# Patient Record
Sex: Female | Born: 1945 | Race: White | Hispanic: No | Marital: Married | Smoking: Former smoker
Health system: Southern US, Community
[De-identification: ages and names within clinical notes are randomized; demographics above are authoritative.]

## PROBLEM LIST (undated history)

## (undated) DIAGNOSIS — F32A Depression, unspecified: Secondary | ICD-10-CM

## (undated) DIAGNOSIS — E785 Hyperlipidemia, unspecified: Secondary | ICD-10-CM

## (undated) DIAGNOSIS — C50411 Malignant neoplasm of upper-outer quadrant of right female breast: Secondary | ICD-10-CM

## (undated) DIAGNOSIS — Z170 Estrogen receptor positive status [ER+]: Secondary | ICD-10-CM

## (undated) DIAGNOSIS — I10 Essential (primary) hypertension: Secondary | ICD-10-CM

## (undated) DIAGNOSIS — I447 Left bundle-branch block, unspecified: Secondary | ICD-10-CM

## (undated) DIAGNOSIS — F329 Major depressive disorder, single episode, unspecified: Secondary | ICD-10-CM

## (undated) DIAGNOSIS — E669 Obesity, unspecified: Secondary | ICD-10-CM

## (undated) DIAGNOSIS — N200 Calculus of kidney: Secondary | ICD-10-CM

## (undated) HISTORY — DX: Hyperlipidemia, unspecified: E78.5

## (undated) HISTORY — DX: Obesity, unspecified: E66.9

## (undated) HISTORY — PX: TONSILLECTOMY: SUR1361

## (undated) HISTORY — DX: Left bundle-branch block, unspecified: I44.7

## (undated) HISTORY — PX: CHOLECYSTECTOMY: SHX55

## (undated) HISTORY — DX: Estrogen receptor positive status (ER+): Z17.0

## (undated) HISTORY — DX: Essential (primary) hypertension: I10

## (undated) HISTORY — PX: ANKLE FRACTURE SURGERY: SHX122

## (undated) HISTORY — DX: Estrogen receptor positive status (ER+): C50.411

---

## 2006-12-02 ENCOUNTER — Ambulatory Visit: Payer: Self-pay

## 2006-12-16 ENCOUNTER — Ambulatory Visit: Payer: Self-pay

## 2007-12-03 ENCOUNTER — Ambulatory Visit: Payer: Self-pay | Admitting: Family Medicine

## 2008-04-10 ENCOUNTER — Encounter: Payer: Self-pay | Admitting: Cardiovascular Disease

## 2008-04-21 ENCOUNTER — Ambulatory Visit: Payer: Self-pay | Admitting: Cardiovascular Disease

## 2008-04-26 ENCOUNTER — Ambulatory Visit: Payer: Self-pay

## 2008-08-24 ENCOUNTER — Ambulatory Visit: Payer: Self-pay | Admitting: Gastroenterology

## 2008-12-06 ENCOUNTER — Ambulatory Visit: Payer: Self-pay | Admitting: Family Medicine

## 2008-12-12 DIAGNOSIS — I447 Left bundle-branch block, unspecified: Secondary | ICD-10-CM | POA: Insufficient documentation

## 2008-12-12 DIAGNOSIS — E785 Hyperlipidemia, unspecified: Secondary | ICD-10-CM | POA: Insufficient documentation

## 2008-12-12 DIAGNOSIS — E663 Overweight: Secondary | ICD-10-CM | POA: Insufficient documentation

## 2008-12-12 DIAGNOSIS — R0602 Shortness of breath: Secondary | ICD-10-CM | POA: Insufficient documentation

## 2008-12-12 DIAGNOSIS — I10 Essential (primary) hypertension: Secondary | ICD-10-CM | POA: Insufficient documentation

## 2010-01-11 ENCOUNTER — Ambulatory Visit: Payer: Self-pay | Admitting: Family Medicine

## 2010-07-07 HISTORY — PX: GASTRIC BYPASS: SHX52

## 2010-11-19 NOTE — Letter (Signed)
April 21, 2008    Galen Daft. Timoteo Gaul, MD  [facility]  [address]  [place], [place] [address]   RE:  Julia Sandoval, Julia Sandoval  MRN:  [id number]  /  DOB:  Oct 06, 1945   Dear Dr. Timoteo Gaul:   It was my pleasure to see Julia Sandoval on April 21, 2008, for  evaluation of abnormal EKG.   The patient is a delightful 65 year old woman with a history of  progressive exercise intolerance as well as exertional dyspnea.  She was  recently evaluated with EKG and echocardiogram.  I have a report of her  echocardiogram dated, April 07, 2008, which showed normal LV chamber  size and systolic function with an LVEF of 55%.  There is a notation of  mild LVH and mild diastolic dysfunction without significant valvular  abnormalities.  An EKG dated, April 10, 2008, shows sinus rhythm with  left bundle-branch block.   The patient has no history of cardiac disease.  She has no exertional  chest pain.  She does have stable exertional dyspnea with low-level  activity and also complains of marked fatigue with less than 5 minutes  of exercise.  She has attributed this to obesity.  She recently has had  an upper respiratory infection with cough and chest wall soreness over  the last 3 weeks, but this is slowly improving.  She denies  palpitations, lightheadedness, orthopnea, PND, edema, or syncope.   Current medications include:  1. Enalapril 5 mg daily.  2. Citalopram 30 mg daily.  3. Pravastatin 20 mg daily.  4. Multivitamin one daily.  5. Ginkgo one daily.  6. Vitamin E one daily.   ALLERGIES:  NKDA.   PAST MEDICAL HISTORY:  1. Essential hypertension.  2. Dyslipidemia.  3. Knee surgery in 2000.  4. Remote cesarean section x2 greater than 20 years ago.   No other hospitalizations or chronic illnesses.   SOCIAL HISTORY:  The patient is married.  She has 2 grown children.  She  lives locally in [place].  She is originally from [place] and  has lived here for approximately 3  years.  She works for a city [employer] in  [employer].  She is a former smoker, but quit in 2000.  She  drinks alcohol on rare occasion.  She does not participate in regular  exercise.   FAMILY HISTORY:  The patient's mother died at age 69 of a stroke and a  myocardial infarction.  Father died at age 71 from also Alzheimer  disease.  She has 3 siblings who are alive and well.   PHYSICAL EXAMINATION:  GENERAL:  The patient is alert and oriented.  She  is in no acute distress.  VITAL SIGNS:  Weight is 243 pounds, height 5 feet 6 inches, blood  pressure 128/86, heart rate is 88, respiratory rate 16.  HEENT:  Normal.  NECK:  Normal carotid upstrokes.  No bruits.  JVP normal.  No  thyromegaly or thyroid nodules.  LUNGS:  Clear bilaterally.  HEART:  The apex is discreet and nondisplaced.  There is no right  ventricular heave or lift.  The heart is regular rate and rhythm.  No  murmurs or gallops.  ABDOMEN:  Soft, obese, nontender.  No organomegaly.  No abdominal  bruits.  BACK:  No CVA tenderness.  EXTREMITIES:  No clubbing, cyanosis, or edema.  Peripheral pulses intact  and equal.  SKIN:  Warm and dry without rash.  NEUROLOGIC:  Cranial nerves II  through XII are intact.  Strength intact  and equal.   EKG reviewed shows sinus rhythm with a heart rate of 70 beats per  minute.  There is a first-degree AV block with a PR interval of 212  msec, complete left bundle-branch block is noted.   Labs from April 06, 2008, showed normal thyroid studies.  Unremarkable  CBC with hemoglobin 15.1, white blood cell count 8100, platelet count  293.  Glucose is 97, BUN and creatinine her 24 and 0.63, potassium 4.0.  LFTs were within normal limits .  I do not have a copy of her lipid  panel.   Echo as outlined above shows normal LV function and no significant  valvular abnormalities.   ASSESSMENT:  This is a 65 year old woman with exercise intolerance, left  bundle-branch block with a  background of hypertension, dyslipidemia,  former tobacco use, and obesity.  In the setting of her multiple cardiac  risk factors, I think she should undergo risk stratification for  ischemic heart disease.  Recommended an adenosine Myoview stress scan to  rule out significant ischemic heart disease.  Her echo results are  reassuring with normal left ventricular function.   Further recommendations pending the results of her stress study.  I  would like to see her back in followup after the study is completed.   Dr. Timoteo Gaul, thanks again for the opportunity to see this very nice lady.  Please feel free to call at any time with questions.    Sincerely,      Veverly Fells. Excell Seltzer, MD  Electronically Signed    MDC/MedQ  DD: 04/21/2008  DT: 04/22/2008  Job #: [id number]

## 2010-12-26 ENCOUNTER — Encounter: Payer: Self-pay | Admitting: Cardiovascular Disease

## 2011-02-21 ENCOUNTER — Ambulatory Visit: Payer: Self-pay | Admitting: Family Medicine

## 2011-02-25 ENCOUNTER — Ambulatory Visit: Payer: Self-pay | Admitting: Emergency Medicine

## 2011-02-26 ENCOUNTER — Ambulatory Visit: Payer: Self-pay | Admitting: Emergency Medicine

## 2011-02-28 ENCOUNTER — Ambulatory Visit: Payer: Self-pay | Admitting: Emergency Medicine

## 2011-03-04 LAB — PATHOLOGY REPORT

## 2011-07-08 HISTORY — PX: COLONOSCOPY: SHX174

## 2011-07-08 HISTORY — PX: BREAST EXCISIONAL BIOPSY: SUR124

## 2011-07-18 ENCOUNTER — Inpatient Hospital Stay: Payer: Self-pay | Admitting: General Practice

## 2011-07-18 LAB — CBC
HCT: 44 % (ref 35.0–47.0)
HGB: 15.2 g/dL (ref 12.0–16.0)
MCH: 32.1 pg (ref 26.0–34.0)
MCV: 93 fL (ref 80–100)
Platelet: 273 10*3/uL (ref 150–440)
RBC: 4.73 10*6/uL (ref 3.80–5.20)
WBC: 12.1 10*3/uL — ABNORMAL HIGH (ref 3.6–11.0)

## 2011-07-18 LAB — COMPREHENSIVE METABOLIC PANEL
Albumin: 3.6 g/dL (ref 3.4–5.0)
Alkaline Phosphatase: 71 U/L (ref 50–136)
BUN: 17 mg/dL (ref 7–18)
Bilirubin,Total: 0.4 mg/dL (ref 0.2–1.0)
Calcium, Total: 9.6 mg/dL (ref 8.5–10.1)
Chloride: 109 mmol/L — ABNORMAL HIGH (ref 98–107)
Co2: 24 mmol/L (ref 21–32)
Creatinine: 0.82 mg/dL (ref 0.60–1.30)
EGFR (African American): >60
Glucose: 100 mg/dL — ABNORMAL HIGH (ref 65–99)
Osmolality: 287 (ref 275–301)
SGOT(AST): 41 U/L — ABNORMAL HIGH (ref 15–37)
Sodium: 143 mmol/L (ref 136–145)

## 2011-07-18 LAB — TROPONIN I: Troponin-I: <0.02 ng/mL

## 2011-07-20 LAB — BASIC METABOLIC PANEL
Anion Gap: 8 (ref 7–16)
BUN: 8 mg/dL (ref 7–18)
Calcium, Total: 8 mg/dL — ABNORMAL LOW (ref 8.5–10.1)
Creatinine: 0.73 mg/dL (ref 0.60–1.30)
EGFR (African American): >60
EGFR (Non-African Amer.): >60
Sodium: 144 mmol/L (ref 136–145)

## 2011-07-20 LAB — HEMOGLOBIN: HGB: 12.3 g/dL (ref 12.0–16.0)

## 2011-07-23 ENCOUNTER — Encounter: Payer: Self-pay | Admitting: Internal Medicine

## 2011-08-08 ENCOUNTER — Encounter: Payer: Self-pay | Admitting: Internal Medicine

## 2011-08-18 LAB — URINALYSIS, COMPLETE
Blood: NEGATIVE
Glucose,UR: NEGATIVE mg/dL (ref 0–75)
Nitrite: NEGATIVE
Ph: 5 (ref 4.5–8.0)
Protein: NEGATIVE
RBC,UR: 2 /HPF (ref 0–5)
Squamous Epithelial: 7

## 2011-10-17 ENCOUNTER — Ambulatory Visit: Payer: Self-pay | Admitting: Bariatrics

## 2011-10-17 LAB — COMPREHENSIVE METABOLIC PANEL
Albumin: 3.4 g/dL (ref 3.4–5.0)
Alkaline Phosphatase: 13 U/L — ABNORMAL LOW (ref 50–136)
Anion Gap: 11 (ref 7–16)
BUN: 15 mg/dL (ref 7–18)
Bilirubin,Total: 0.6 mg/dL (ref 0.2–1.0)
Calcium, Total: 8.8 mg/dL (ref 8.5–10.1)
Chloride: 106 mmol/L (ref 98–107)
Co2: 20 mmol/L — ABNORMAL LOW (ref 21–32)
Creatinine: 0.53 mg/dL — ABNORMAL LOW (ref 0.60–1.30)
EGFR (African American): >60
EGFR (Non-African Amer.): >60
Osmolality: 275 (ref 275–301)
SGOT(AST): 31 U/L (ref 15–37)
Sodium: 137 mmol/L (ref 136–145)
Total Protein: 7.7 g/dL (ref 6.4–8.2)

## 2011-10-17 LAB — CBC WITH DIFFERENTIAL/PLATELET
Eosinophil %: 3.1 %
HCT: 44.2 % (ref 35.0–47.0)
HGB: 14.6 g/dL (ref 12.0–16.0)
Lymphocyte %: 32.3 %
MCH: 29.7 pg (ref 26.0–34.0)
MCHC: 33 g/dL (ref 32.0–36.0)
Monocyte #: 0.7 x10 3/mm (ref 0.2–0.9)
Monocyte %: 10.4 %
Neutrophil #: 3.6 10*3/uL (ref 1.4–6.5)
Platelet: 304 10*3/uL (ref 150–440)
RBC: 4.92 10*6/uL (ref 3.80–5.20)
WBC: 6.7 10*3/uL (ref 3.6–11.0)

## 2011-10-17 LAB — FOLATE: Folic Acid: 28.8 ng/mL (ref 3.1–100.0)

## 2011-10-17 LAB — PROTIME-INR
INR: 0.9
Prothrombin Time: 12.2 secs (ref 11.5–14.7)

## 2011-10-17 LAB — IRON AND TIBC: Unbound Iron-Bind.Cap.: 427 ug/dL

## 2011-10-17 LAB — HEMOGLOBIN A1C: Hemoglobin A1C: 6.1 % (ref 4.2–6.3)

## 2011-10-24 ENCOUNTER — Ambulatory Visit: Payer: Self-pay | Admitting: Bariatrics

## 2011-10-30 ENCOUNTER — Ambulatory Visit: Payer: Self-pay | Admitting: Bariatrics

## 2011-10-31 ENCOUNTER — Ambulatory Visit: Payer: Self-pay | Admitting: Bariatrics

## 2011-11-05 ENCOUNTER — Ambulatory Visit: Payer: Self-pay | Admitting: Bariatrics

## 2011-11-27 ENCOUNTER — Ambulatory Visit: Payer: Self-pay | Admitting: Bariatrics

## 2012-01-26 ENCOUNTER — Ambulatory Visit: Payer: Self-pay | Admitting: Bariatrics

## 2012-02-05 ENCOUNTER — Ambulatory Visit: Payer: Self-pay | Admitting: Bariatrics

## 2012-02-27 ENCOUNTER — Ambulatory Visit: Payer: Self-pay | Admitting: Family Medicine

## 2012-05-21 ENCOUNTER — Ambulatory Visit: Payer: Self-pay

## 2012-06-07 ENCOUNTER — Ambulatory Visit: Payer: Self-pay | Admitting: Emergency Medicine

## 2012-06-08 ENCOUNTER — Ambulatory Visit: Payer: Self-pay | Admitting: Emergency Medicine

## 2012-07-05 ENCOUNTER — Other Ambulatory Visit: Payer: Self-pay | Admitting: Bariatrics

## 2012-07-05 LAB — FERRITIN: Ferritin (ARMC): 49 ng/mL (ref 8–388)

## 2012-07-05 LAB — IRON: Iron: 103 ug/dL (ref 50–170)

## 2012-07-05 LAB — PHOSPHORUS: Phosphorus: 3.2 mg/dL (ref 2.5–4.9)

## 2012-07-05 LAB — AMYLASE: Amylase: 29 U/L (ref 25–115)

## 2013-04-13 ENCOUNTER — Ambulatory Visit: Payer: Self-pay | Admitting: Family Medicine

## 2013-06-09 ENCOUNTER — Ambulatory Visit: Payer: Self-pay | Admitting: Obstetrics & Gynecology

## 2013-06-09 LAB — CBC
MCH: 30.2 pg (ref 26.0–34.0)
MCHC: 33.8 g/dL (ref 32.0–36.0)
MCV: 89 fL (ref 80–100)
Platelet: 237 10*3/uL (ref 150–440)
RBC: 4.64 10*6/uL (ref 3.80–5.20)
RDW: 12.8 % (ref 11.5–14.5)

## 2013-06-17 ENCOUNTER — Ambulatory Visit: Payer: Self-pay | Admitting: Obstetrics & Gynecology

## 2013-06-28 ENCOUNTER — Ambulatory Visit: Payer: Self-pay | Admitting: Family Medicine

## 2013-07-04 ENCOUNTER — Ambulatory Visit: Payer: Self-pay | Admitting: Family Medicine

## 2013-07-04 HISTORY — PX: BREAST SURGERY: SHX581

## 2013-07-05 LAB — PATHOLOGY REPORT

## 2013-07-07 HISTORY — PX: DILATION AND CURETTAGE OF UTERUS: SHX78

## 2013-07-13 ENCOUNTER — Encounter: Payer: Self-pay | Admitting: *Deleted

## 2013-08-09 ENCOUNTER — Encounter: Payer: Self-pay | Admitting: General Surgery

## 2013-08-09 ENCOUNTER — Other Ambulatory Visit: Payer: BC Managed Care – PPO

## 2013-08-09 ENCOUNTER — Ambulatory Visit (INDEPENDENT_AMBULATORY_CARE_PROVIDER_SITE_OTHER): Payer: BC Managed Care – PPO | Admitting: General Surgery

## 2013-08-09 VITALS — BP 130/70 | HR 68 | Resp 12 | Ht 65.0 in | Wt 185.0 lb

## 2013-08-09 DIAGNOSIS — N6081 Other benign mammary dysplasias of right breast: Secondary | ICD-10-CM

## 2013-08-09 DIAGNOSIS — N63 Unspecified lump in unspecified breast: Secondary | ICD-10-CM

## 2013-08-09 DIAGNOSIS — N6089 Other benign mammary dysplasias of unspecified breast: Secondary | ICD-10-CM

## 2013-08-09 NOTE — Progress Notes (Signed)
Patient ID: Julia Sandoval, female   DOB: [birth year], 68 y.o.   MRN: [id number]  Chief Complaint  Patient presents with  . Other    mammogram    HPI Julia Sandoval is a 68 y.o. female who presents for a breast evaluation. The most recent mammogram was done on 07/04/13.Patient had an  right stereo biopsy done 07/04/14 Patient does perform regular self breast checks and gets regular mammograms done.    HPI  Past Medical History  Diagnosis Date  . Dyspnea   . Obesity   . Hypertension     Unspecified  . Hyperlipidemia     Mixed  . LBBB (left bundle branch block)     Past Surgical History  Procedure Laterality Date  . Total knee arthroplasty    . Cesarean section    . Gastric bypass  2012  . Dilation and curettage of uterus  2015  . Breast surgery Right 07/04/2013    Stereotactic biopsy for asymmetric density and calcifications: Flat epithelial atypia, intraductal papilloma, fragmented.    Family History  Problem Relation Age of Onset  . Stroke Other     Social History History  Substance Use Topics  . Smoking status: Former Smoker    Quit date: 07/07/1998  . Smokeless tobacco: Never Used  . Alcohol Use: Yes    No Known Allergies  Current Outpatient Prescriptions  Medication Sig Dispense Refill  . FLUoxetine (PROZAC) 20 MG capsule Take 1 capsule by mouth daily.       No current facility-administered medications for this visit.    Review of Systems Review of Systems  Constitutional: Negative.   Respiratory: Negative.   Cardiovascular: Negative.     Blood pressure 130/70, pulse 68, resp. rate 12, height 5\' 5"  (1.651 m), weight 185 lb (83.915 kg).  Physical Exam Physical Exam  Constitutional: She is oriented to person, place, and time. She appears well-developed and well-nourished.  Eyes: Conjunctivae are normal.  Neck: Neck supple.  Cardiovascular: Normal rate, regular rhythm and normal heart sounds.   Pulmonary/Chest: Breath sounds normal. Right breast  exhibits no inverted nipple, no mass, no nipple discharge, no skin change and no tenderness. Left breast exhibits no inverted nipple, no mass, no nipple discharge, no skin change and no tenderness.    Right breast 9 o'clock position, 7cm from the nipple showed a smoothly marginated fullness likely secondary to the biopsy.  Lymphadenopathy:    She has no cervical adenopathy.    She has no axillary adenopathy.  Neurological: She is alert and oriented to person, place, and time.  Skin: Skin is warm and dry.    Data Reviewed Review of the mammograms from 2000 06/08/2013 showed a focal asymmetry in the upper quadrant (9:00) position of the right breast it became more definitive over time. The remaining breasts are essentially fatty replaced. In 2014 she developed microcalcifications in this area.  Core biopsy completed by the radiology service on 07/04/2013 showed flat epithelial atypia (columnar cell changes with atypia), fibrocystic changes with associated microcalcifications as well as an intraductal fragmented papilloma. No evidence of malignancy.   Ultrasound examination of the right breast at the 9:00 position 7 cm from the nipple confirmed a 0.83 x 0.8 x 1.16 cm hypoechoic area with dense acoustic shadowing consistent with her previous biopsy site. This correlated with the postbiopsy mammograms.  Assessment    With the findings of a peripherally based papilloma as well as the epithelial atypia, wide excision of the area has been recommended  to confirm no upstaging.      Plan    Plan biopsy procedure was reviewed. The risks associated with surgery including those related to bleeding and infection were discussed.    Patient's surgery has been scheduled for 09-06-13 at [facility].   Robert Bellow 08/10/2013, 7:39 AM

## 2013-08-09 NOTE — Patient Instructions (Addendum)
Patient to have a right breast biopsy at [facility] Breast Biopsy A breast biopsy is a procedure where a sample of breast tissue is removed from your breast. The tissue is examined under a microscope to see if cancerous cells are present. A breast biopsy is done when there is:  Any undiagnosed breast mass (tumor).  Nipple abnormalities, dimpling, crusting, or ulcerations.  Abnormal discharge from the nipple, especially blood.  Redness, swelling, and pain of the breast.  Calcium deposits (calcifications) or abnormalities seen on a mammogram, ultrasound result, or results of magnetic resonance imaging (MRI).  Suspicious changes in the breast seen on your mammogram. If the tumor is found to be cancerous (malignant), a breast biopsy can help to determine what the best treatment is for you. There are many different types of breast biopsies. Talk to your caregiver about your options and which type is best for you. LET YOUR CAREGIVER KNOW ABOUT:  Allergies to food or medicine.  Medicines taken, including vitamins, herbs, eyedrops, over-the-counter medicines, and creams.  Use of steroids (by mouth or creams).  Previous problems with anesthetics or numbing medicines.  History of bleeding problems or blood clots.  Previous surgery.  Other health problems, including diabetes and kidney problems.  Any recent colds or infections.  Possibility of pregnancy, if this applies. RISKS AND COMPLICATIONS   Bleeding.  Infection.  Allergy to medicines.  Bruising and swelling of the breast.  Alteration in the shape of the breast.  Not finding the lump or abnormality.  Needing more surgery. BEFORE THE PROCEDURE  Arrange for someone to drive you home after the procedure.  Do not smoke for 2 weeks before the procedure. Stop smoking, if you smoke.  Do not drink alcohol for 24 hours before procedure.  Wear a good support bra to the procedure. PROCEDURE  You may be given a medicine to numb  the breast area (local anesthesia) or a medicine to make you sleep (general anesthesia) during the procedure. The following are the different types of biopsies that can be performed.   Fine-needle aspiration A thin needle is attached to a syringe and inserted into the breast lump. Fluid and cells are removed and then looked at under a microscope. If the breast lump cannot be felt, an ultrasound may be used to help locate the lump and place the needle in the correct area.   Core needle biopsy A wide, hollow needle (core needle) is inserted into the breast lump 3 6 times to get tissue samples or cores. The samples are removed. The needle is usually placed in the correct area by using an ultrasound or X-ray.   Stereotactic biopsy X-ray equipment and a computer are used to analyze X-ray pictures of the breast lump. The computer then finds exactly where the core needle needs to be inserted. Tissue samples are removed.   Vacuum-assisted biopsy A small incision (less than  inch) is made in your breast. A biopsy device that includes a hollow needle and vacuum is passed through the incision and into the breast tissue. The vacuum gently draws abnormal breast tissue into the needle to remove it. This type of biopsy removes a larger tissue sample than a regular core needle biopsy. No stitches are needed, and there is usually little scarring.  Ultrasound-guided core needle biopsy A high frequency ultrasound helps guide the core needle to the area of the mass or abnormality. An incision is made to insert the needle. Tissue samples are removed.  Open biopsy A larger incision  is made in the breast. Your caregiver will attempt to remove the whole breast lump or as much as possible. AFTER THE PROCEDURE  You will be taken to the recovery area. If you are doing well and have no problems, you will be allowed to go home.  You may notice bruising on your breast. This is normal.  Your caregiver may apply a pressure  dressing on your breast for 24 48 hours. A pressure dressing is a bandage that is wrapped tightly around the chest to stop fluid from collecting underneath tissues. Document Released: 06/23/2005 Document Revised: 10/18/2012 Document Reviewed: 07/24/2011 [facility] Patient Information 2014 [place].  Patient's surgery has been scheduled for 09-06-13 at [facility].

## 2013-08-10 ENCOUNTER — Other Ambulatory Visit: Payer: Self-pay | Admitting: General Surgery

## 2013-08-10 ENCOUNTER — Telehealth: Payer: Self-pay | Admitting: *Deleted

## 2013-08-10 ENCOUNTER — Encounter: Payer: Self-pay | Admitting: General Surgery

## 2013-08-10 DIAGNOSIS — N6081 Other benign mammary dysplasias of right breast: Secondary | ICD-10-CM

## 2013-08-10 NOTE — Telephone Encounter (Signed)
Patient has been scheduled for a pre-op visit on 08-30-13 at 10:45 am. She is aware of date, time, and instructions.

## 2013-08-10 NOTE — Telephone Encounter (Signed)
Message copied by Dominga Ferry on Wed Aug 10, 2013  9:48 AM ------      Message from: Allen, Forest Gleason      Created: Wed Aug 10, 2013  7:49 AM       This patient will need to come in for a very brief, no charge visit the week prior to surgery to confirm the area of concern is still visible ultrasound/clinical exam. Thank you ------

## 2013-08-29 ENCOUNTER — Ambulatory Visit: Payer: Self-pay | Admitting: Family Medicine

## 2013-08-30 ENCOUNTER — Ambulatory Visit (INDEPENDENT_AMBULATORY_CARE_PROVIDER_SITE_OTHER): Payer: BC Managed Care – PPO | Admitting: General Surgery

## 2013-08-30 ENCOUNTER — Telehealth: Payer: Self-pay

## 2013-08-30 ENCOUNTER — Other Ambulatory Visit (INDEPENDENT_AMBULATORY_CARE_PROVIDER_SITE_OTHER): Payer: BC Managed Care – PPO

## 2013-08-30 ENCOUNTER — Encounter: Payer: Self-pay | Admitting: General Surgery

## 2013-08-30 ENCOUNTER — Telehealth: Payer: Self-pay | Admitting: *Deleted

## 2013-08-30 VITALS — BP 158/78 | HR 66 | Resp 14 | Ht 65.0 in | Wt 188.0 lb

## 2013-08-30 DIAGNOSIS — N63 Unspecified lump in unspecified breast: Secondary | ICD-10-CM

## 2013-08-30 DIAGNOSIS — D249 Benign neoplasm of unspecified breast: Secondary | ICD-10-CM

## 2013-08-30 NOTE — Telephone Encounter (Signed)
Santiago Glad at [facility] called and said that the patient refused to have a cardiac cath done. The spoke with Dr Neoma Laming her cardiologist about this and he said he will still give cardiac clearance for her to have her surgery. She is faxing over the cardiac clearance.

## 2013-08-30 NOTE — Progress Notes (Signed)
Patient ID: Julia Sandoval, female   DOB: June 30, 1946, 68 y.o.   MRN: [id number]  Chief Complaint  Patient presents with  . Follow-up    breast ultrasoun    HPI Julia Sandoval is a 68 y.o. female here today for an right breast ultrasound to confirm the previous biopsy site is visible. She had undergone vacuum biopsy by the radiology staff with findings of flat epithelial atypia as well as an intraductal papilloma of frank malignancy. We are planning for reexcision of this area in the near future.  HPI  Past Medical History  Diagnosis Date  . Dyspnea   . Obesity   . Hypertension     Unspecified  . Hyperlipidemia     Mixed  . LBBB (left bundle branch block)     Past Surgical History  Procedure Laterality Date  . Total knee arthroplasty    . Cesarean section    . Gastric bypass  2012  . Dilation and curettage of uterus  2015  . Breast surgery Right 07/04/2013    Stereotactic biopsy for asymmetric density and calcifications: Flat epithelial atypia, intraductal papilloma, fragmented.    Family History  Problem Relation Age of Onset  . Stroke Other     Social History History  Substance Use Topics  . Smoking status: Former Smoker    Quit date: 07/07/1998  . Smokeless tobacco: Never Used  . Alcohol Use: Yes    No Known Allergies  Current Outpatient Prescriptions  Medication Sig Dispense Refill  . FLUoxetine (PROZAC) 20 MG capsule Take 1 capsule by mouth daily.       No current facility-administered medications for this visit.    Review of Systems Review of Systems  Constitutional: Negative.   Respiratory: Negative.   Cardiovascular: Negative.     Blood pressure 158/78, pulse 66, resp. rate 14, height 5\' 5"  (1.651 m), weight 188 lb (85.276 kg).  Physical Exam Physical Exam  Constitutional: She is oriented to person, place, and time. She appears well-developed and well-nourished.  Eyes: Conjunctivae are normal.  Neck: Neck supple.  Cardiovascular: Normal rate,  regular rhythm and normal heart sounds.   Pulmonary/Chest: Breath sounds normal. Right breast exhibits mass. Right breast exhibits no inverted nipple, no nipple discharge, no skin change and no tenderness. Left breast exhibits no inverted nipple, no mass, no nipple discharge, no skin change and no tenderness.    Abdominal: Bowel sounds are normal.  Lymphadenopathy:    She has no cervical adenopathy.    She has no axillary adenopathy.  Neurological: She is alert and oriented to person, place, and time.  Skin: Skin is warm and dry.    Data Reviewed Ultrasound examination of the right breast at the 9:00 position 7 cm from the nipple shows a 0.5 x 0.72 x 0.76 cm hypoechoic area with focal acoustic shadowing consistent with her biopsy site.  Assessment    Focal atypia/papilloma right breast.    Plan    We'll plan to proceed with excision under anesthesia in the near future.       Robert Bellow 08/30/2013, 4:31 PM

## 2013-08-30 NOTE — Telephone Encounter (Signed)
Per Santiago Glad in pre-admit, Dr. Humphrey Rolls will be doing a cardiac cath on this patient on Thursday (09-01-13). He will let us know if patient is cleared for surgery on 09-06-13.

## 2013-09-06 ENCOUNTER — Encounter: Payer: Self-pay | Admitting: General Surgery

## 2013-09-06 ENCOUNTER — Ambulatory Visit: Payer: Self-pay | Admitting: General Surgery

## 2013-09-06 DIAGNOSIS — D4860 Neoplasm of uncertain behavior of unspecified breast: Secondary | ICD-10-CM

## 2013-09-06 HISTORY — PX: BREAST SURGERY: SHX581

## 2013-09-09 LAB — PATHOLOGY REPORT

## 2013-09-12 ENCOUNTER — Encounter: Payer: Self-pay | Admitting: General Surgery

## 2013-09-12 ENCOUNTER — Telehealth: Payer: Self-pay | Admitting: *Deleted

## 2013-09-12 NOTE — Telephone Encounter (Signed)
Patient called the answering service on Friday to reschedule her Wednesday appointment (09-14-13) with Dr. Bary Castilla.   Message was left for patient this morning to call the office to reschedule.

## 2013-09-12 NOTE — Telephone Encounter (Signed)
Julia Sandoval moved patient's appointment to a later time on the same day per patient's request.

## 2013-09-14 ENCOUNTER — Ambulatory Visit (INDEPENDENT_AMBULATORY_CARE_PROVIDER_SITE_OTHER): Payer: BC Managed Care – PPO | Admitting: General Surgery

## 2013-09-14 ENCOUNTER — Ambulatory Visit: Payer: BC Managed Care – PPO | Admitting: General Surgery

## 2013-09-14 ENCOUNTER — Encounter: Payer: Self-pay | Admitting: General Surgery

## 2013-09-14 VITALS — BP 140/74 | HR 76 | Resp 14 | Ht 65.0 in | Wt 185.0 lb

## 2013-09-14 DIAGNOSIS — N6091 Unspecified benign mammary dysplasia of right breast: Secondary | ICD-10-CM

## 2013-09-14 DIAGNOSIS — D249 Benign neoplasm of unspecified breast: Secondary | ICD-10-CM

## 2013-09-14 DIAGNOSIS — N62 Hypertrophy of breast: Secondary | ICD-10-CM

## 2013-09-14 MED ORDER — RALOXIFENE HCL 60 MG PO TABS: 60.0000 mg | ORAL_TABLET | Freq: Every day | ORAL | Status: DC

## 2013-09-14 NOTE — Patient Instructions (Addendum)
Patient to try Evista . Patient to return in one month.

## 2013-09-14 NOTE — Progress Notes (Signed)
Patient ID: Julia Sandoval, female   DOB: [birth year], 68 y.o.   MRN: [id number]  Chief Complaint  Patient presents with  . Routine Post Op    Right breast wide excision    HPI Julia Sandoval is a 68 y.o. female here today for her post of right breast wide excision done on 09/06/13. Patient states she is doing well.  HPI  Past Medical History  Diagnosis Date  . Dyspnea   . Obesity   . Hypertension     Unspecified  . Hyperlipidemia     Mixed  . LBBB (left bundle branch block)     Past Surgical History  Procedure Laterality Date  . Total knee arthroplasty    . Cesarean section    . Gastric bypass  2012  . Dilation and curettage of uterus  2015  . Breast surgery Right 07/04/2013    Stereotactic biopsy for asymmetric density and calcifications: Flat epithelial atypia, intraductal papilloma, fragmented.  . Breast surgery Right 09/06/13    right breast wide excision    Family History  Problem Relation Age of Onset  . Stroke Other     Social History History  Substance Use Topics  . Smoking status: Former Smoker    Quit date: 07/07/1998  . Smokeless tobacco: Never Used  . Alcohol Use: Yes    No Known Allergies  Current Outpatient Prescriptions  Medication Sig Dispense Refill  . buPROPion (WELLBUTRIN XL) 150 MG 24 hr tablet       . FLUoxetine (PROZAC) 20 MG capsule Take 1 capsule by mouth daily.      . raloxifene (EVISTA) 60 MG tablet Take 1 tablet (60 mg total) by mouth daily.  30 tablet  11   No current facility-administered medications for this visit.    Review of Systems Review of Systems  Constitutional: Negative.   Respiratory: Negative.   Cardiovascular: Negative.     Blood pressure 140/74, pulse 76, resp. rate 14, height 5\' 5"  (1.651 m), weight 185 lb (83.915 kg).  Physical Exam Physical Exam  Constitutional: She is oriented to person, place, and time. She appears well-developed and well-nourished.  Eyes: Conjunctivae are normal.  Neck: Neck supple.   Cardiovascular: Normal rate, regular rhythm and normal heart sounds.   Pulmonary/Chest: Effort normal and breath sounds normal.  Right breast incision looks clean and healing well.   Lymphadenopathy:    She has no cervical adenopathy.    She has no axillary adenopathy.  Neurological: She is alert and oriented to person, place, and time.  Skin: Skin is warm and dry.    Data Reviewed Pathology showed focal epithelial atypia without DCIS.  Assessment    Doing well status post sedation of right breast papilloma with incidental finding of focal epithelial atypia.     Plan    The indication for the use of anti-estrogens to minimize the risk of recurrent problems and to decrease her risk of malignancy by 50% was discussed. She is taking two medications that would interfere with the metabolism of tamoxifen. She reports that they are working well for her psychiatric symptoms and it would be ideal to avoid a change if possible.  If costs does not become an issue, Evista 60 mg daily has been recommended.   We'll plan for a followup examination in one month.       Julia Sandoval 09/17/2013, 3:10 PM

## 2013-09-20 ENCOUNTER — Telehealth: Payer: Self-pay | Admitting: *Deleted

## 2013-09-20 NOTE — Telephone Encounter (Signed)
Calling to ask patient how much Evista was at her pharmacy copay/coupons etc.? 

## 2013-09-22 NOTE — Telephone Encounter (Signed)
Her copay with insurance is $100/month. Uses Julia Sandoval and checked with [employer]. She is to check with [employer] coupon website. She will check with pharmacy and call us back, and I told her I would let you known and if there was anything else to do we would call her.

## 2013-09-26 NOTE — Telephone Encounter (Signed)
Would put things on hold until follow up.  We can discuss other options at that time, but would likely require Dr. Bernita Buffy to change her present medications.  Will review pros/ cons at that time.

## 2013-09-27 NOTE — Telephone Encounter (Signed)
Coupon at front desk for pt to pick up

## 2013-09-28 ENCOUNTER — Telehealth: Payer: Self-pay | Admitting: *Deleted

## 2013-09-28 NOTE — Telephone Encounter (Signed)
She would like Brand name Evista called to [employer] (they will help her with the coupon) and it should be $30/month. RX called in.

## 2013-09-28 NOTE — Telephone Encounter (Signed)
Send RX for Evista, 60 mg, #30, 11 refills to pharmacy. Thanks.

## 2013-10-18 ENCOUNTER — Ambulatory Visit: Payer: BC Managed Care – PPO | Admitting: General Surgery

## 2013-10-20 ENCOUNTER — Encounter: Payer: Self-pay | Admitting: General Surgery

## 2013-10-20 ENCOUNTER — Ambulatory Visit (INDEPENDENT_AMBULATORY_CARE_PROVIDER_SITE_OTHER): Payer: BC Managed Care – PPO | Admitting: General Surgery

## 2013-10-20 VITALS — BP 134/72 | HR 76 | Resp 14 | Ht 66.0 in | Wt 180.0 lb

## 2013-10-20 DIAGNOSIS — N6089 Other benign mammary dysplasias of unspecified breast: Secondary | ICD-10-CM

## 2013-10-20 DIAGNOSIS — N6099 Unspecified benign mammary dysplasia of unspecified breast: Secondary | ICD-10-CM | POA: Insufficient documentation

## 2013-10-20 DIAGNOSIS — N6091 Unspecified benign mammary dysplasia of right breast: Secondary | ICD-10-CM

## 2013-10-20 DIAGNOSIS — D249 Benign neoplasm of unspecified breast: Secondary | ICD-10-CM

## 2013-10-20 NOTE — Progress Notes (Signed)
Patient ID: Julia Sandoval, female   DOB: 1945-08-08, 68 y.o.   MRN: [id number]  Chief Complaint  Patient presents with  . Routine Post Op    right breast wide excision    HPI Julia Sandoval is a 68 y.o. female here today for her one month post of right breast wide excision done on 09/06/13. Patient states she is doing well. The patient has initiated Evista therapy. Minimal side effects. This medication was chosen as there was less coflict with her previously prescribed antidepressants.  HPI  Past Medical History  Diagnosis Date  . Dyspnea   . Obesity   . Hypertension     Unspecified  . Hyperlipidemia     Mixed  . LBBB (left bundle branch block)     Past Surgical History  Procedure Laterality Date  . Total knee arthroplasty    . Cesarean section    . Gastric bypass  2012  . Dilation and curettage of uterus  2015  . Colonoscopy  2013  . Breast surgery Right 07/04/2013    Stereotactic biopsy for asymmetric density and calcifications: Flat epithelial atypia, intraductal papilloma, fragmented.  . Breast surgery Right 09/06/13    right breast wide excision    Family History  Problem Relation Age of Onset  . Stroke Other     Social History History  Substance Use Topics  . Smoking status: Former Smoker    Quit date: 07/07/1998  . Smokeless tobacco: Never Used  . Alcohol Use: Yes    No Known Allergies  Current Outpatient Prescriptions  Medication Sig Dispense Refill  . buPROPion (WELLBUTRIN XL) 150 MG 24 hr tablet       . FLUoxetine (PROZAC) 20 MG capsule Take 1 capsule by mouth daily.      Marland Kitchen lamoTRIgine (LAMICTAL) 100 MG tablet Take 100 mg by mouth daily.       . raloxifene (EVISTA) 60 MG tablet Take 60 mg by mouth daily.       No current facility-administered medications for this visit.    Review of Systems Review of Systems  Constitutional: Negative.   Respiratory: Negative.   Cardiovascular: Negative.     Blood pressure 134/72, pulse 76, resp. rate 14, height  5\' 6"  (1.676 m), weight 180 lb (81.647 kg).  Physical Exam Physical Exam  Constitutional: She is oriented to person, place, and time. She appears well-developed and well-nourished.  Cardiovascular: Normal rate, regular rhythm and normal heart sounds.   Pulmonary/Chest: Effort normal and breath sounds normal.  Right breast incision looks clean and healing well.   Neurological: She is alert and oriented to person, place, and time.  Skin: Skin is warm and dry.    Data Reviewed Breast biopsy showing evidence of ADH and flat epithelial atypia. Clear margins.  Assessment    Atypical ductal hyperplasia.     Plan    Arrangements will be made for bilateral mammograms in December 2014 at [facility] per patient request. Followup exam at that time. She continued to make use of Evista as prescribed.       PCP.Dear, Dyanne Iha Treazure Nery 10/20/2013, 9:01 PM

## 2013-10-20 NOTE — Patient Instructions (Addendum)
Patient to return in eight month bilateral breast diagnotic mammogram

## 2014-01-13 ENCOUNTER — Encounter: Payer: Self-pay | Admitting: General Surgery

## 2014-01-13 NOTE — Progress Notes (Signed)
Patient ID: Julia Sandoval, female   DOB: Aug 12, 1945, 68 y.o.   MRN: [id number] RX for Evista, 60 mg, # 90 w/ 4 refills sent to [facility].

## 2014-05-08 ENCOUNTER — Encounter: Payer: Self-pay | Admitting: General Surgery

## 2014-06-06 ENCOUNTER — Ambulatory Visit: Payer: BC Managed Care – PPO | Admitting: General Surgery

## 2014-06-13 ENCOUNTER — Ambulatory Visit: Payer: Self-pay | Admitting: General Surgery

## 2014-06-14 ENCOUNTER — Encounter: Payer: Self-pay | Admitting: General Surgery

## 2014-06-22 ENCOUNTER — Ambulatory Visit: Payer: BC Managed Care – PPO | Admitting: General Surgery

## 2014-07-04 ENCOUNTER — Encounter: Payer: Self-pay | Admitting: General Surgery

## 2014-07-04 ENCOUNTER — Ambulatory Visit (INDEPENDENT_AMBULATORY_CARE_PROVIDER_SITE_OTHER): Payer: BC Managed Care – PPO | Admitting: General Surgery

## 2014-07-04 VITALS — BP 132/70 | HR 70 | Resp 12 | Ht 65.0 in | Wt 170.0 lb

## 2014-07-04 DIAGNOSIS — N62 Hypertrophy of breast: Secondary | ICD-10-CM

## 2014-07-04 DIAGNOSIS — N6091 Unspecified benign mammary dysplasia of right breast: Secondary | ICD-10-CM

## 2014-07-04 NOTE — Patient Instructions (Signed)
Continue self breast exams. Call office for any new breast issues or concerns. 

## 2014-07-04 NOTE — Progress Notes (Signed)
Patient ID: Julia Sandoval, female   DOB: Jun 21, 1946, 68 y.o.   MRN: [id number]  Chief Complaint  Patient presents with  . Follow-up    HPI Julia Sandoval is a 68 y.o. female.  who presents for her 8 month follow up breast evaluation. The most recent mammogram was done on 06-13-14.  Patient does perform regular self breast checks and gets regular mammograms done.   No new breast issues. HPI  Past Medical History  Diagnosis Date  . Dyspnea   . Obesity   . Hypertension     Unspecified  . Hyperlipidemia     Mixed  . LBBB (left bundle branch block)     Past Surgical History  Procedure Laterality Date  . Total knee arthroplasty    . Cesarean section    . Gastric bypass  2012  . Dilation and curettage of uterus  2015  . Colonoscopy  2013  . Breast surgery Right 07/04/2013    Stereotactic biopsy for asymmetric density and calcifications: Flat epithelial atypia, intraductal papilloma, fragmented.  . Breast surgery Right 09/06/13    right breast wide excision    Family History  Problem Relation Age of Onset  . Stroke Other     Social History History  Substance Use Topics  . Smoking status: Former Smoker    Quit date: 07/07/1998  . Smokeless tobacco: Never Used  . Alcohol Use: Yes    No Known Allergies  Current Outpatient Prescriptions  Medication Sig Dispense Refill  . buPROPion (WELLBUTRIN XL) 150 MG 24 hr tablet     . QUEtiapine (SEROQUEL) 100 MG tablet Take 100 mg by mouth at bedtime.    . raloxifene (EVISTA) 60 MG tablet Take 60 mg by mouth daily.     No current facility-administered medications for this visit.    Review of Systems Review of Systems  Constitutional: Negative.   Respiratory: Negative.   Cardiovascular: Negative.     Blood pressure 132/70, pulse 70, resp. rate 12, height 5\' 5"  (1.651 m), weight 170 lb (77.111 kg).  Physical Exam Physical Exam  Constitutional: She is oriented to person, place, and time. She appears well-developed and  well-nourished.  Neck: Neck supple.  Cardiovascular: Normal rate, regular rhythm and normal heart sounds.   Pulmonary/Chest: Effort normal and breath sounds normal. Right breast exhibits no inverted nipple, no mass, no nipple discharge, no skin change and no tenderness. Left breast exhibits no inverted nipple, no mass, no nipple discharge, no skin change and no tenderness.    Lymphadenopathy:    She has no cervical adenopathy.  Neurological: She is alert and oriented to person, place, and time.  Skin: Skin is warm and dry.     4 x 7 cm patch right mid back that is slightly thicken and scaling.     Data Reviewed Bilateral mammograms dated 06/13/2014 completed [facility] were reviewed. No interval change. By red-1.  Assessment    Benign breast exam.    Plan    Tolerating antiestrogen therapy well.    Patient to return in one year with bilateral screening mammogram.   The patient has been encouraged to seek dermatology evaluation of the area on the right back as it is symptomatic and unexplained.  PCP:  Zella Richer 07/05/2014, 6:52 AM

## 2014-10-27 NOTE — Op Note (Signed)
PATIENT NAME:  Julia Sandoval, Julia Sandoval MR#:  [id number] DATE OF BIRTH:  12/25/1945  DATE OF PROCEDURE:  06/17/2013  PREOPERATIVE DIAGNOSIS: Postmenopausal bleeding and polyps.   POSTOPERATIVE DIAGNOSIS: Postmenopausal bleeding and polyps.   PROCEDURE: Hysteroscopy, dilation and curettage procedure with polypectomy.   SURGEON: Glean Salen, MD  ANESTHESIA: General.   ESTIMATED BLOOD LOSS: Minimal.   COMPLICATIONS: None.   FINDINGS: Multiple intrauterine and intracervical polyps.   DISPOSITION: To recovery room stable.   TECHNIQUE: The patient is prepped and draped in the usual sterile fashion after adequate anesthesia is obtained in the dorsal lithotomy position. Bladder is drained with a Robinson catheter. Speculum is placed, and the anterior lip of the cervix is grasped with a single-tooth tenaculum. The cervix is dilated with a small dilator, and the uterus is sounded to 7 cm. A 30 degree hysteroscope with glycine distention of intrauterine cavity is performed, with the above-mentioned findings visualized. Using a polypectomy forceps, several of the polyps are removed manually. Then, using a hysteroscopic resectoscope, the remaining portions of any polyps are removed as well as their bases are cauterized to prevent recurrence. Excellent hemostasis is noted. The curettings were sent separately along with the polyps to pathology for further review. Hysteroscope is removed, with excellent hemostasis noted and a zero discrepancy of glycine fluid. A 0 Vicryl suture is placed on the tenaculum spot to minimize bleeding. The patient goes to the recovery room in stable condition. All sponge, instrument and needle counts are correct.   ____________________________ R. Barnett Applebaum, MD rph:lb D: 06/17/2013 09:58:06 ET T: 06/17/2013 10:09:28 ET JOB#: [id number]  cc: Glean Salen, MD, <Dictator> Gae Dry MD ELECTRONICALLY SIGNED 06/17/2013 13:10

## 2014-10-28 NOTE — Op Note (Signed)
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  OPERATIVE PROCEDURE: Wide local excision, mastoplasty.  OPERATING SURGEON: Hervey Ard, MD.   ANESTHESIA: General by LMA under Dr. Kayleen Memos, Marcaine 0.5%. Marcaine with 1:200,000 units of epinephrine local infiltration.   ESTIMATED BLOOD LOSS: Minimal.  CLINICAL NOTE: This 69 year old woman had undergone a vacuum biopsy of an area of mammographic abnormality with findings of flat epithelial atypia. She was felt to be a candidate for wide local excision.   OPERATIVE NOTE: With the patient under adequate general anesthesia, the breast was prepped with ChloraPrep and draped. Ultrasound was used to confirm the previous biopsy cavity. A radial incision was made at the 9 o'clock position after the instillation of Marcaine with epinephrine for postoperative analgesia. The skin was incised sharply and the remaining dissection was completed with electrocautery. A 1 cm area of adipose tissue was divided and then a 4 x 4 x 5 cm block of tissue extending down to, but not including the pectoralis fascia, was then removed, orientated and specimen radiograph obtained. The previously placed biopsy clip was in the center of the specimen. The breast was elevated off the underlying pectoralis fascia circumferentially and then approximated with interrupted 2-0 Vicryl figure-of-eight sutures. The adipose layer was closed in multiple layers. The flaps were raised superiorly and inferiorly, approximately 6 mm in thickness and then this layer was approximated with 2-0 Vicryl interrupted sutures to eliminate any ridging within the breast tissue. The skin was closed with a running 4-0 Vicryl subcuticular suture. Benzoin and Steri-Strips followed by Telfa and fluff gauze  dressing was applied. The patient's bra was placed and she was taken to the recovery room in stable condition.     ____________________________ Robert Bellow, MD jwb:ce D: 09/06/2013 11:09:04 ET T: 09/06/2013 18:07:57 ET JOB#: [id number]  cc: Robert Bellow, MD, <Dictator> Meindert A. Brunetta Genera, MD Adriane Guglielmo Amedeo Kinsman MD ELECTRONICALLY SIGNED 09/08/2013 10:55

## 2014-10-29 NOTE — H&P (Signed)
Subjective/Chief Complaint right ankle pain    History of Present Illness 69 year old female slipped and fell coming down a step, twisting her right ankle. She was unable to stand or bear weight on the right lower extremity due to the pain. She denied any other injuries. She denied any loss of consciousness.   Past Med/Surgical Hx:  Hypercholesterolemia:   Depression:   HTN:   Cholecystectomy:   ALLERGIES:  No Known Allergies:   HOME MEDICATIONS:  enalapril 10 mg oral tablet: 1 tab(s) orally once a day, Active  pravastatin 40 mg oral tablet: 1 tab(s) orally once a day , Active  Wellbutrin $RemoveBef'500mg'gednXgGRle$ :  Every Day, Active  Family and Social History:   Family History Non-Contributory    Social History negative tobacco, negative Illicit drugs, Married.    Place of Living Home   Review of Systems:   Fever/Chills No    Cough No    Sputum No    Abdominal Pain No    Diarrhea No    Constipation No    Nausea/Vomiting No    SOB/DOE No    Chest Pain No   Physical Exam:   GEN WD, WN, obese    HEENT PERRL, Oropharynx clear    NECK supple    RESP normal resp effort  clear BS  no use of accessory muscles    CARD regular rate  no murmur  No LE edema  no JVD    ABD denies tenderness  soft  normal BS    EXTR Right ankle: Good capillary refill. Tender to medial and lateral  malleoli.  Splint in place.    NEURO motor/sensory function intact    PSYCH alert, A+O to time, place, person, good insight   Routine Chem:  11-Jan-13 18:12    Glucose, Serum 100   BUN 17   Creatinine (comp) 0.82   Potassium, Serum 4.4   Chloride, Serum 109   CO2, Serum 24   Calcium (Total), Serum 9.6  Hepatic:  11-Jan-13 18:12    Bilirubin, Total 0.4   Alkaline Phosphatase 71   SGPT (ALT) 39   SGOT (AST) 41   Total Protein, Serum 7.5   Albumin, Serum 3.6  Routine Chem:  11-Jan-13 18:12    Osmolality (calc) 287   eGFR (African American) >60   eGFR (Non-African American) >60   Anion  Gap 10  Routine Hem:  11-Jan-13 18:12    WBC (CBC) 12.1   RBC (CBC) 4.73   Hemoglobin (CBC) 15.2   Hematocrit (CBC) 44.0   Platelet Count (CBC) 273   MCV 93   MCH 32.1   MCHC 34.5   RDW 13.2  Cardiac:  11-Jan-13 18:12    Troponin I < 0.02     Assessment/Admission Diagnosis Right bimalleolar ankle fracture    Plan Closed reduction performed by ED physician. Recommend ORIF.  The risks and benefits of surgical intervention were discussed in detail with the patient. The patient expressed understanding of the risks and benefits and agreed with plans for surgery.   Surgical site signed as per "right site surgery" protocol.   Electronic Signatures: Dereck Leep (MD)  (Signed 11-Jan-13 23:07)  Authored: CHIEF COMPLAINT and HISTORY, PAST MEDICAL/SURGIAL HISTORY, ALLERGIES, HOME MEDICATIONS, FAMILY AND SOCIAL HISTORY, REVIEW OF SYSTEMS, PHYSICAL EXAM, LABS, ASSESSMENT AND PLAN   Last Updated: 11-Jan-13 23:07 by Dereck Leep (MD)

## 2014-10-29 NOTE — Op Note (Signed)
PATIENT NAME:  Julia Sandoval, Julia Sandoval MR#:  [id number] DATE OF BIRTH:  05-06-1946  DATE OF PROCEDURE:  07/19/2011  PREOPERATIVE DIAGNOSIS: Right bimalleolar ankle fracture.   POSTOPERATIVE DIAGNOSIS: Right bimalleolar ankle fracture.   PROCEDURE PERFORMED: Open reduction and internal fixation of right bimalleolar ankle fracture.   SURGEON: Laurice Record. Hooten, MD  ANESTHESIA: General.   ESTIMATED BLOOD LOSS: Minimal.   FLUIDS REPLACED: 700 mL of crystalloid.   TOURNIQUET TIME: 87 minutes.   DRAINS: None.   IMPLANTS UTILIZED: Synthes 8-hole one-third tubular plate, seven 3.5 mm cortical screws, one 4.0 mm fully threaded cancellous screw, and two 4.0 mm partially threaded cannulated cancellous screws.   INDICATIONS FOR SURGERY: The patient is a 69 year old female who slipped and fell twisting her right ankle. X-rays demonstrated a grossly displaced right bimalleolar ankle fracture. Provisional reduction had been performed in the emergency department. Recommendation was made for open reduction and internal fixation. Risks and benefits of surgical intervention were discussed in detail with the patient. She expressed her understanding of the risks and benefits and agreed with plans for surgical intervention.   PROCEDURE IN DETAIL: The patient was brought to the Operating Room and, after adequate general anesthesia was achieved, a tourniquet was placed on the patient's upper right thigh. The patient's right foot, ankle and lower leg were cleaned and prepped with alcohol and DuraPrep and draped in the usual sterile fashion. A "time out" was performed as per usual protocol. The right lower extremity was exsanguinated using an Esmarch, and the tourniquet was inflated to 300 mmHg. A lateral longitudinal incision made over the distal fibula. Dissection was carried down to the fracture site. A hematoma was evacuated and the site irrigated. Provisional reduction of the fracture if the distal fibula was performed and  maintained using bone reduction forceps with points. An 8-hole one-third tubular plate was then contoured so as to fit the lateral aspect of the distal fibula. The plate was then secured using a total of seven 3.5 mm cortical screws and one 4.0 mm fully threaded cancellus screw. Excellent reduction was appreciated in multiple planes using FluoroScan. The wound was irrigated with copious amounts of normal saline with antibiotic solution. The wound was then closed in layers using first #0 Vicryl followed by #2-0 Vicryl. Next, a posterior medial incision was made and dissection was carried down to the fracture site of the medial malleolus. Provisional reduction was performed and maintained using bone reduction forceps. Two 1.25 mm distally threaded guide pins were inserted in a retrograde fashion. Good position was appreciated. Two 4.0 mm partially threaded cannulated cancellus screws were then advanced over the guide pins. Good compression at the fracture site was appreciated. Guidewires were removed and the ankle was evaluated using FluoroScan. Excellent restoration of the ankle mortise was appreciated.   The medial wound was irrigated with copious amounts of normal saline with antibiotic solution. The wound was closed in layers using first #0 Vicryl followed by #2-0 Vicryl. The medial and lateral incisions were then closed using skin staples. 10 mL of 0.25% Marcaine was injected along the incision sites. A sterile dressing was applied followed by application of a posterior splint. The tourniquet was deflated after total tourniquet time of 87 minutes.   The patient tolerated the procedure well. She was transported to the Recovery Room in stable condition.  ____________________________ Laurice Record. Holley Bouche., MD jph:slb D: 07/19/2011 15:51:37 ET T: 07/19/2011 16:07:09 ET JOB#: [id number]  cc: Jeneen Rinks P. Holley Bouche., MD, <Dictator> JAMES P HOOTEN  JR MD ELECTRONICALLY SIGNED 07/25/2011 6:40

## 2014-10-29 NOTE — Discharge Summary (Signed)
PATIENT NAME:  Julia Sandoval, Julia Sandoval MR#:  [id number] DATE OF BIRTH:  1946/05/23  DATE OF ADMISSION:  07/18/2011 DATE OF DISCHARGE:  07/23/2011   ADMITTING DIAGNOSIS: Bimalleolar right ankle fracture.   DISCHARGE DIAGNOSIS: Bimalleolar right ankle fracture.  HISTORY: The patient is a 69 year old female who slipped and fell coming down a set of steps, twisting her right ankle. She was unable to stand or bear weight on the right lower extremity secondary to the increased discomfort. She denied any other injuries. She denied any loss of consciousness. The patient was seen at [facility] where x-rays were taken and revealed a bimalleolar ankle fracture. After discussion of the risks and benefits of surgical intervention, the patient expressed her understanding of the risks and benefits and agreed with plans for surgical intervention. The patient was admitted to the hospital. She had no medical problems and subsequently no medical consult was obtained.   PROCEDURE: Open reduction and internal fixation of right bimalleolar ankle fracture.   ANESTHESIA: General.   IMPLANTS UTILIZED: Synthes eight-hole one-third tubular plate, seven 3.5-mm cortical screws, one 4-mm fully threaded cancellus screw, and two 4-mm partially-threaded cannulated cancellus screws.   HOSPITAL COURSE: The patient tolerated the procedure very well. She had no complications. She was then taken to the PAC-U where she was stabilized and then transferred to the orthopedic floor. She began receiving anticoagulation therapy of Lovenox 30 mg subcutaneous every 12 hours per anesthesia and pharmacy protocol. However, this was changed to 40 mg q. 12 due to the size of the patient. She was fitted with TED stockings on the nonoperative leg. This was allowed to be removed one hour per eight-hour shift. She was also fitted with AVI compression foot pump on the nonoperative leg set at 130 mmHg. The right leg was elevated on two  pillows and Polar Care was applied to maintain a temperature of 40 to 50 degrees Fahrenheit. She has had normal capillary refill. Sensation to light touch has been intact and within normal limits to the right lower extremity. Able to move the toes well.   The patient's vital signs have been stable. She has been afebrile. Hemodynamically she was stable. No transfusions were given. She has denied any chest pain or shortness of breath. She was noted to have quite a bit of discomfort initially but this was finally under control upon being transferred.   Physical therapy was initiated on day one for gait training and transfers. This has been extremely slow due to condition and size. She has been unstable, having difficulty getting up and down, and moving. She was unable to move more than 5 feet during the hospital stay. Occupational therapy was also initiated on day one for activities of daily living and assistive devices.   DISPOSITION: The patient is being discharged to skilled nursing facility in improved stable condition.   DISCHARGE INSTRUCTIONS:  1. She is to be nonweightbearing to the right lower extremity. Continue elevating on two pillows.  2. Continue Polar Care maintaining a temperature of 40 to 50 degrees Fahrenheit.  3. Elevate the left heel off the bed using rolled towels. Trapeze frame to the bed.  4. She is placed on a regular diet.  5. She will need to follow up in the clinic in 5 to 6 days for a wound check and application of a more permanent orthotic.  6. Incentive spirometer q. 1 hour while awake. 7. Physical therapy for gait training and transfers. 8. Occupational therapy for activities  of daily living and assistive devices. 9.   Gatch foot of the bed.  DRUG ALLERGIES: No known drug allergies.   MEDICATIONS:  1. Roxicodone 5 to 10 mg every four hours p.r.n. for pain. 2. Tylenol ES 500 to 1000 mg every four hours p.r.n. for pain.  3. Senokot-S 1 tablet b.i.d.  4. Milk of  magnesia 30 mL b.i.d. p.r.n.  5. Dulcolax suppositories 10 mg rectally daily p.r.n. for constipation.  6. Enema soapsuds if no results with Milk of Magnesia.  7. Protonix 40 mg b.i.d.  8. Wellbutrin XL 300 mg daily. 9. Vasotec 20 mg daily. 10. Pravachol 40 mg at bedtime.  11. Celexa 40 mg daily.  12. Lovenox 40 mg subcutaneous every 12 hours for 14 days then discontinue and begin taking one 81-mg enteric-coated aspirin.   PAST MEDICAL HISTORY:  1. Hypercholesterolemia.  2. Depression.  3. Hypertension.   ____________________________ Vance Peper, PA jrw:bjt D: 07/23/2011 07:26:44 ET T: 07/23/2011 08:20:23 ET JOB#: [id number]  cc: Vance Peper, PA, <Dictator> Nolberto Cheuvront PA ELECTRONICALLY SIGNED 07/25/2011 16:13

## 2015-01-24 ENCOUNTER — Other Ambulatory Visit: Payer: Self-pay | Admitting: General Surgery

## 2015-02-20 ENCOUNTER — Other Ambulatory Visit: Payer: Self-pay | Admitting: General Surgery

## 2015-02-20 DIAGNOSIS — Z1231 Encounter for screening mammogram for malignant neoplasm of breast: Secondary | ICD-10-CM

## 2015-04-06 DIAGNOSIS — E559 Vitamin D deficiency, unspecified: Secondary | ICD-10-CM | POA: Insufficient documentation

## 2015-04-06 DIAGNOSIS — F329 Major depressive disorder, single episode, unspecified: Secondary | ICD-10-CM | POA: Insufficient documentation

## 2015-04-06 DIAGNOSIS — F419 Anxiety disorder, unspecified: Secondary | ICD-10-CM | POA: Insufficient documentation

## 2015-04-06 DIAGNOSIS — F32A Depression, unspecified: Secondary | ICD-10-CM | POA: Insufficient documentation

## 2015-04-11 ENCOUNTER — Encounter: Payer: Self-pay | Admitting: General Surgery

## 2015-04-11 ENCOUNTER — Telehealth: Payer: Self-pay | Admitting: *Deleted

## 2015-04-11 NOTE — Telephone Encounter (Signed)
I talked with the patient at length regarding the contraindications. She states she has not went to the pharmacy to obtain the new RX Osphena (for vaginal dryness) as of yet. She wishes to not pick up the new medications at this time and continue the Evista. She is aware to call for further questions and to call if she has trouble refilling her Evista. She may call Dr Rogelia Mire to see about natural treatment options.

## 2015-04-11 NOTE — Telephone Encounter (Signed)
-----   Message from Robert Bellow, MD sent at 04/11/2015 12:04 PM EDT ----- Please notify the patient I received a warning lead or from her pharmacy service. Since her last visit here in December when she was making use of raloxifen, she has been started on Osfena. These 2 medications each increase her risk ofblood clots. She should discuss with her PCP other alternatives to this new medication if she wishes to continue on the raloxifene

## 2015-04-27 ENCOUNTER — Other Ambulatory Visit: Payer: Self-pay

## 2015-04-27 DIAGNOSIS — Z1231 Encounter for screening mammogram for malignant neoplasm of breast: Secondary | ICD-10-CM

## 2015-06-12 ENCOUNTER — Other Ambulatory Visit: Payer: Self-pay | Admitting: General Surgery

## 2015-06-12 ENCOUNTER — Ambulatory Visit
Admission: RE | Admit: 2015-06-12 | Discharge: 2015-06-12 | Disposition: A | Discharge status: Home / Self Care | Payer: BLUE CROSS/BLUE SHIELD | Source: Ambulatory Visit | Attending: General Surgery | Admitting: General Surgery

## 2015-06-12 DIAGNOSIS — Z1231 Encounter for screening mammogram for malignant neoplasm of breast: Secondary | ICD-10-CM | POA: Diagnosis not present

## 2015-06-12 DIAGNOSIS — R928 Other abnormal and inconclusive findings on diagnostic imaging of breast: Secondary | ICD-10-CM

## 2015-06-15 ENCOUNTER — Ambulatory Visit: Payer: Self-pay

## 2015-06-18 ENCOUNTER — Ambulatory Visit: Payer: Self-pay

## 2015-06-26 ENCOUNTER — Ambulatory Visit: Payer: BLUE CROSS/BLUE SHIELD | Attending: General Surgery

## 2015-06-26 ENCOUNTER — Ambulatory Visit: Payer: Self-pay | Admitting: General Surgery

## 2015-06-26 ENCOUNTER — Other Ambulatory Visit: Payer: BLUE CROSS/BLUE SHIELD

## 2015-07-04 ENCOUNTER — Emergency Department: Payer: BLUE CROSS/BLUE SHIELD

## 2015-07-04 ENCOUNTER — Encounter: Payer: Self-pay | Admitting: Medical Oncology

## 2015-07-04 ENCOUNTER — Emergency Department
Admission: EM | Admit: 2015-07-04 | Discharge: 2015-07-05 | Payer: BLUE CROSS/BLUE SHIELD | Attending: Emergency Medicine | Admitting: Emergency Medicine

## 2015-07-04 DIAGNOSIS — Z87891 Personal history of nicotine dependence: Secondary | ICD-10-CM | POA: Diagnosis not present

## 2015-07-04 DIAGNOSIS — K859 Acute pancreatitis without necrosis or infection, unspecified: Secondary | ICD-10-CM | POA: Insufficient documentation

## 2015-07-04 DIAGNOSIS — Z79899 Other long term (current) drug therapy: Secondary | ICD-10-CM | POA: Insufficient documentation

## 2015-07-04 DIAGNOSIS — I10 Essential (primary) hypertension: Secondary | ICD-10-CM | POA: Insufficient documentation

## 2015-07-04 DIAGNOSIS — K8051 Calculus of bile duct without cholangitis or cholecystitis with obstruction: Secondary | ICD-10-CM | POA: Diagnosis not present

## 2015-07-04 DIAGNOSIS — R1013 Epigastric pain: Secondary | ICD-10-CM | POA: Diagnosis present

## 2015-07-04 HISTORY — DX: Major depressive disorder, single episode, unspecified: F32.9

## 2015-07-04 HISTORY — DX: Depression, unspecified: F32.A

## 2015-07-04 LAB — URINALYSIS COMPLETE WITH MICROSCOPIC (ARMC ONLY)
Bilirubin Urine: NEGATIVE
Glucose, UA: NEGATIVE mg/dL
HGB URINE DIPSTICK: NEGATIVE
Leukocytes, UA: NEGATIVE
NITRITE: NEGATIVE
PROTEIN: NEGATIVE mg/dL
SPECIFIC GRAVITY, URINE: 1.033 — AB (ref 1.005–1.030)
pH: 6 (ref 5.0–8.0)

## 2015-07-04 LAB — CBC
HEMATOCRIT: 33.2 % — AB (ref 35.0–47.0)
HEMOGLOBIN: 11.2 g/dL — AB (ref 12.0–16.0)
MCH: 30.4 pg (ref 26.0–34.0)
MCHC: 33.8 g/dL (ref 32.0–36.0)
MCV: 89.9 fL (ref 80.0–100.0)
Platelets: 362 10*3/uL (ref 150–440)
RBC: 3.7 MIL/uL — ABNORMAL LOW (ref 3.80–5.20)
RDW: 17.5 % — ABNORMAL HIGH (ref 11.5–14.5)
WBC: 6.2 10*3/uL (ref 3.6–11.0)

## 2015-07-04 LAB — TROPONIN I

## 2015-07-04 LAB — COMPREHENSIVE METABOLIC PANEL
ALBUMIN: 3.5 g/dL (ref 3.5–5.0)
ALT: 247 U/L — ABNORMAL HIGH (ref 14–54)
ANION GAP: 8 (ref 5–15)
AST: 361 U/L — ABNORMAL HIGH (ref 15–41)
Alkaline Phosphatase: 347 U/L — ABNORMAL HIGH (ref 38–126)
BILIRUBIN TOTAL: 2 mg/dL — AB (ref 0.3–1.2)
BUN: 10 mg/dL (ref 6–20)
CHLORIDE: 109 mmol/L (ref 101–111)
CO2: 24 mmol/L (ref 22–32)
Calcium: 9.2 mg/dL (ref 8.9–10.3)
Creatinine, Ser: 0.47 mg/dL (ref 0.44–1.00)
GFR calc Af Amer: >60 mL/min (ref >60)
GFR calc non Af Amer: >60 mL/min (ref >60)
GLUCOSE: 154 mg/dL — AB (ref 65–99)
POTASSIUM: 3.9 mmol/L (ref 3.5–5.1)
SODIUM: 141 mmol/L (ref 135–145)
TOTAL PROTEIN: 6.8 g/dL (ref 6.5–8.1)

## 2015-07-04 LAB — LIPASE, BLOOD: LIPASE: 9933 U/L — AB (ref 11–51)

## 2015-07-04 MED ORDER — SODIUM CHLORIDE 0.9 % IV BOLUS (SEPSIS)
1000.0000 mL | Freq: Once | INTRAVENOUS | Status: AC
  Administered 2015-07-04: 1000 mL via INTRAVENOUS

## 2015-07-04 MED ORDER — IOHEXOL 240 MG/ML SOLN
25.0000 mL | Freq: Once | INTRAMUSCULAR | Status: AC | PRN
  Administered 2015-07-04: 25 mL via ORAL

## 2015-07-04 MED ORDER — ONDANSETRON HCL 4 MG/2ML IJ SOLN
INTRAMUSCULAR | Status: AC
  Filled 2015-07-04: qty 2

## 2015-07-04 MED ORDER — ONDANSETRON HCL 4 MG/2ML IJ SOLN
4.0000 mg | Freq: Once | INTRAMUSCULAR | Status: AC
  Administered 2015-07-04: 4 mg via INTRAVENOUS
  Filled 2015-07-04: qty 2

## 2015-07-04 MED ORDER — IOHEXOL 300 MG/ML  SOLN
100.0000 mL | Freq: Once | INTRAMUSCULAR | Status: AC | PRN
Start: 2015-07-04 — End: 2015-07-04
  Administered 2015-07-04: 100 mL via INTRAVENOUS

## 2015-07-04 MED ORDER — ONDANSETRON HCL 4 MG/2ML IJ SOLN
4.0000 mg | Freq: Once | INTRAMUSCULAR | Status: AC
  Administered 2015-07-04: 4 mg via INTRAVENOUS

## 2015-07-04 MED ORDER — HYDROMORPHONE HCL 1 MG/ML IJ SOLN
1.0000 mg | Freq: Once | INTRAMUSCULAR | Status: AC
  Administered 2015-07-04: 1 mg via INTRAVENOUS
  Filled 2015-07-04: qty 1

## 2015-07-04 MED ORDER — MORPHINE SULFATE (PF) 4 MG/ML IV SOLN
4.0000 mg | Freq: Once | INTRAVENOUS | Status: AC
  Administered 2015-07-04: 4 mg via INTRAVENOUS
  Filled 2015-07-04: qty 1

## 2015-07-04 NOTE — ED Notes (Signed)
Pt reports upper abd pain that began 2 hrs pta, reports nausea without vomiting.

## 2015-07-04 NOTE — ED Provider Notes (Addendum)
[facility] Emergency Department Provider Note  ____________________________________________   I have reviewed the triage vital signs and the nursing notes.   HISTORY  Chief Complaint Abdominal Pain    HPI Julia Sandoval is a 69 y.o. female of the history of gastric bypass 3 years ago presents today complaining of epigastric abdominal pain and vomiting this afternoon. It began gradually. Denies any fever or chills. Vomiting started while she was here. As had no hematemesis no diarrhea. The pain is in the epigastric region. There she has no chest pain or shortness of breath. She denies melena or bright red blood per rectum.   Past Medical History  Diagnosis Date  . Dyspnea   . Obesity   . Hypertension     Unspecified  . Hyperlipidemia     Mixed  . LBBB (left bundle branch block)   . Depression     Patient Active Problem List   Diagnosis Date Noted  . Atypical ductal hyperplasia of right breast 10/20/2013  . 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 Flat epithelial atypia of right breast 08/10/2013  . HYPERLIPIDEMIA-MIXED 12/12/2008  . OVERWEIGHT/OBESITY 12/12/2008  . HYPERTENSION, UNSPECIFIED 12/12/2008  . LBBB 12/12/2008  . DYSPNEA 12/12/2008    Past Surgical History  Procedure Laterality Date  . Total knee arthroplasty    . Cesarean section    . Gastric bypass  2012  . Dilation and curettage of uterus  2015  . Colonoscopy  2013  . Breast surgery Right 07/04/2013    Stereotactic biopsy for asymmetric density and calcifications: Flat epithelial atypia, intraductal papilloma, fragmented.  . Breast surgery Right 09/06/13    Right breast wide excision, ADH only  . Breast biopsy Right     neg bx    Current Outpatient Rx  Name  Route  Sig  Dispense  Refill  . buPROPion (WELLBUTRIN XL) 150 MG 24 hr tablet               . QUEtiapine (SEROQUEL) 100 MG tablet   Oral   Take 100 mg by mouth at bedtime.         . raloxifene (EVISTA) 60  MG tablet      TAKE 1 TABLET DAILY   90 tablet   2     Allergies Review of patient's allergies indicates no known allergies.  Family History  Problem Relation Age of Onset  . Stroke Other     Social History Social History  Substance Use Topics  . Smoking status: Former Smoker    Quit date: 07/07/1998  . Smokeless tobacco: Never Used  . Alcohol Use: Yes    Review of Systems Constitutional: No fever/chills Eyes: No visual changes. ENT: No sore throat. No stiff neck no neck pain Cardiovascular: Denies chest pain. Respiratory: Denies shortness of breath. Gastrointestinal:   Positive retching/vomiting.  No diarrhea.  No constipation. Genitourinary: Negative for dysuria. Musculoskeletal: Negative lower extremity swelling Skin: Negative for rash. Neurological: Negative for headaches, focal weakness or numbness. 10-point ROS otherwise negative.  ____________________________________________   PHYSICAL EXAM:  VITAL SIGNS: ED Triage Vitals  Enc Vitals Group     BP 07/04/15 1755 155/91 mmHg     Pulse Rate 07/04/15 1755 63     Resp 07/04/15 1755 18     Temp 07/04/15 1755 97.8 F (36.6 C)     Temp Source 07/04/15 1755 Oral     SpO2 07/04/15 1755 97 %     Weight 07/04/15 1755 170 lb (  77.111 kg)     Height 07/04/15 1755 5\' 4"  (1.626 m)     Head Cir --      Peak Flow --      Pain Score 07/04/15 1755 8     Pain Loc --      Pain Edu? --      Excl. in [place]? --     Constitutional: Alert and oriented. Well appearing and in no acute distress. Eyes: Conjunctivae are normal. PERRL. EOMI. Head: Atraumatic. Nose: No congestion/rhinnorhea. Mouth/Throat: Mucous membranes are moist.  Oropharynx non-erythematous. Neck: No stridor.   Nontender with no meningismus Cardiovascular: Normal rate, regular rhythm. Grossly normal heart sounds.  Good peripheral circulation. Respiratory: Normal respiratory effort.  No retractions. Lungs CTAB. Abdominal: Soft with epigastric tenderness and  voluntary guarding No distention.no  rebound Back:  There is no focal tenderness or step off there is no midline tenderness there are no lesions noted. there is no CVA tenderness Musculoskeletal: No lower extremity tenderness. No joint effusions, no DVT signs strong distal pulses no edema Neurologic:  Normal speech and language. No gross focal neurologic deficits are appreciated.  Skin:  Skin is warm, dry and intact. No rash noted. Psychiatric: Mood and affect are normal. Speech and behavior are normal.  ____________________________________________   LABS (all labs ordered are listed, but only abnormal results are displayed)  Labs Reviewed  LIPASE, BLOOD - Abnormal; Notable for the following:    Lipase 9933 (*)    All other components within normal limits  COMPREHENSIVE METABOLIC PANEL - Abnormal; Notable for the following:    Glucose, Bld 154 (*)    AST 361 (*)    ALT 247 (*)    Alkaline Phosphatase 347 (*)    Total Bilirubin 2.0 (*)    All other components within normal limits  CBC - Abnormal; Notable for the following:    RBC 3.70 (*)    Hemoglobin 11.2 (*)    HCT 33.2 (*)    RDW 17.5 (*)    All other components within normal limits  TROPONIN I  URINALYSIS COMPLETEWITH MICROSCOPIC ([facility] ONLY)   ____________________________________________  EKG  I personally interpreted any EKGs ordered by me or triage ______________________________________  RADIOLOGY  I reviewed any imaging ordered by me or triage that were performed during my shift ____________________________________________   PROCEDURES  Procedure(s) performed: None  Critical Care performed: None  ____________________________________________   INITIAL IMPRESSION / ASSESSMENT AND PLAN / ED COURSE  Pertinent labs & imaging results that were available during my care of the patient were reviewed by me and considered in my medical decision making (see chart for details).  Patient says was gastric bypass  with a lipase of 9933, suggestive of acute pancreatitis, and CT scan is pending. Liver function tests are also somewhat elevated. Patient also may require ultrasound if CT does not betray causative pathology. Patient is status post cholecystectomy in the past of note. For this reason we will start with CT.    ----------------------------------------- 10:44 PM on 07/04/2015 -----------------------------------------  I have paged GI for this, awaiting call back.  Dr. Vira Agar states that he does not form ERCP. I have therefore talked to the patient and she would prefer to be transferred to [facility]. We are paging the [facility] transfer center. Patient requires ERCP for choledocholithiasis  .----------------------------------------- 11:36 PM on 07/04/2015 -----------------------------------------  Awaiting callback from [facility] patient comfortable at this time.Marland Kitchen  ----------------------------------------- 11:40 PM on 07/04/2015 -----------------------------------------  Discussed care with the excellent doctor [facility]  Sabra Heck, who graciously accepts patient in ER to ER transfer. ____________________________________________   FINAL CLINICAL IMPRESSION(S) / ED DIAGNOSES  Final diagnoses:  None     Schuyler Amor, MD 07/04/15 2029  Schuyler Amor, MD 07/04/15 2049  Schuyler Amor, MD 07/04/15 [place], MD 07/04/15 2257  Schuyler Amor, MD 07/04/15 [id number]  Schuyler Amor, MD 07/04/15 [phone]

## 2015-07-04 NOTE — ED Notes (Signed)
Report given to Christine, RN

## 2015-07-04 NOTE — ED Notes (Signed)
Pt presents to ED with c/o severe abdominal pain and nausea. Pt appears diaphoretic. Pt is pacing around room, moaning and unable to sit still. Pt dry heaving multiple times during assessment. Pt is awake and alert, able to answer questions appropriately at this time... Husband at bedside.

## 2015-07-04 NOTE — ED Notes (Signed)
Pt has returned from CT... Assisted pt to urinate on toilet. Pt now resting on stretcher. Reports increasing nausea. MD made aware of same. VORB 4mg  zofran IV once. Order to be carried out by this RN.

## 2015-07-05 DIAGNOSIS — K851 Biliary acute pancreatitis without necrosis or infection: Secondary | ICD-10-CM | POA: Insufficient documentation

## 2015-07-05 MED ORDER — PROMETHAZINE HCL 25 MG/ML IJ SOLN
INTRAMUSCULAR | Status: AC
  Administered 2015-07-05: 12.5 mg via INTRAVENOUS
  Filled 2015-07-05: qty 1

## 2015-07-05 MED ORDER — PROMETHAZINE HCL 25 MG/ML IJ SOLN
12.5000 mg | Freq: Four times a day (QID) | INTRAMUSCULAR | Status: DC | PRN
  Administered 2015-07-05: 12.5 mg via INTRAVENOUS

## 2015-07-05 NOTE — ED Notes (Signed)
Per dr. Burlene Arnt request, pt placed on 2L o2 via 

## 2015-07-08 HISTORY — PX: SMALL INTESTINE SURGERY: SHX150

## 2015-08-29 ENCOUNTER — Encounter: Payer: Self-pay | Admitting: *Deleted

## 2015-08-31 ENCOUNTER — Ambulatory Visit: Payer: BLUE CROSS/BLUE SHIELD | Attending: General Surgery

## 2015-08-31 ENCOUNTER — Other Ambulatory Visit: Payer: BLUE CROSS/BLUE SHIELD

## 2015-09-18 ENCOUNTER — Ambulatory Visit
Admission: RE | Admit: 2015-09-18 | Discharge: 2015-09-18 | Disposition: A | Discharge status: Home / Self Care | Payer: BLUE CROSS/BLUE SHIELD | Source: Ambulatory Visit | Attending: General Surgery | Admitting: General Surgery

## 2015-09-18 DIAGNOSIS — R928 Other abnormal and inconclusive findings on diagnostic imaging of breast: Secondary | ICD-10-CM | POA: Diagnosis present

## 2015-09-25 ENCOUNTER — Other Ambulatory Visit: Payer: BLUE CROSS/BLUE SHIELD

## 2015-09-25 ENCOUNTER — Ambulatory Visit: Payer: BLUE CROSS/BLUE SHIELD

## 2015-10-29 ENCOUNTER — Telehealth: Payer: Self-pay

## 2015-10-29 NOTE — Telephone Encounter (Signed)
-----   Message from Robert Bellow, MD sent at 10/29/2015  3:17 PM EDT ----- The patient called requesting a refill on her raloxifene. She missed her December appointment. She'll need to have her follow-up visit before we can refill her medications.

## 2015-11-06 ENCOUNTER — Ambulatory Visit (INDEPENDENT_AMBULATORY_CARE_PROVIDER_SITE_OTHER): Payer: BLUE CROSS/BLUE SHIELD | Admitting: General Surgery

## 2015-11-06 ENCOUNTER — Encounter: Payer: Self-pay | Admitting: General Surgery

## 2015-11-06 VITALS — BP 128/66 | HR 74 | Resp 14 | Ht 64.0 in | Wt 166.0 lb

## 2015-11-06 DIAGNOSIS — N62 Hypertrophy of breast: Secondary | ICD-10-CM | POA: Diagnosis not present

## 2015-11-06 DIAGNOSIS — N6091 Unspecified benign mammary dysplasia of right breast: Secondary | ICD-10-CM

## 2015-11-06 MED ORDER — RALOXIFENE HCL 60 MG PO TABS: 60.0000 mg | ORAL_TABLET | Freq: Every day | ORAL | Status: DC

## 2015-11-06 NOTE — Progress Notes (Signed)
Patient ID: Julia Sandoval, female   DOB: [birth year], 70 y.o.   MRN: [id number]  Chief Complaint  Patient presents with  . Follow-up    Breast cancer    HPI Julia Sandoval is a 70 y.o. female who presents for a breast evaluation. The most recent mammogram was done on 09/18/2015 .  Patient does perform regular self breast checks and gets regular mammograms done.  No new breast issues. She does need a refill on her Evista. In December 2016 she had surgery for stones in her pancreas and had a proferation of herl intestine the surgery was done at [facility].   The patient reports that she was recently told that she does not require try annual Pap smears due to her age.  I personally reviewed the patient's history.  HPI  Past Medical History  Diagnosis Date  . Dyspnea   . Obesity   . Hypertension     Unspecified  . Hyperlipidemia     Mixed  . LBBB (left bundle branch block)   . Depression     Past Surgical History  Procedure Laterality Date  . Total knee arthroplasty    . Cesarean section    . Gastric bypass  2012  . Dilation and curettage of uterus  2015  . Colonoscopy  2013  . Small intestine surgery  07/2015    Accenditial cut-[facility]  . Breast surgery Right 07/04/2013    Stereotactic biopsy for asymmetric density and calcifications: Flat epithelial atypia, intraductal papilloma, fragmented.  . Breast surgery Right 09/06/13    Right breast wide excision, ADH only  . Breast biopsy Right     neg bx    Family History  Problem Relation Age of Onset  . Stroke Other   . Breast cancer Neg Hx     Social History Social History  Substance Use Topics  . Smoking status: Former Smoker    Quit date: 07/07/1998  . Smokeless tobacco: Never Used  . Alcohol Use: Yes    No Known Allergies  Current Outpatient Prescriptions  Medication Sig Dispense Refill  . raloxifene (EVISTA) 60 MG tablet Take 1 tablet (60 mg total) by mouth daily. 90 tablet 2  . buPROPion (WELLBUTRIN XL) 150 MG 24 hr  tablet Take 150 mg by mouth daily.     . QUEtiapine (SEROQUEL) 100 MG tablet Take 100 mg by mouth at bedtime.    . raloxifene (EVISTA) 60 MG tablet Take 1 tablet (60 mg total) by mouth daily. 90 tablet 3  . VITAMIN D, CHOLECALCIFEROL, PO Take 1 tablet by mouth daily.     No current facility-administered medications for this visit.    Review of Systems Review of Systems  Constitutional: Negative.   Respiratory: Negative.   Cardiovascular: Negative.     Blood pressure 128/66, pulse 74, resp. rate 14, height 5\' 4"  (1.626 m), weight 166 lb (75.297 kg).  Physical Exam Physical Exam  Constitutional: She is oriented to person, place, and time. She appears well-developed and well-nourished.  Eyes: Conjunctivae are normal. No scleral icterus.  Neck: Neck supple. No thyromegaly present.  Cardiovascular: Normal rate, regular rhythm and normal heart sounds.   Pulmonary/Chest: Effort normal and breath sounds normal. Right breast exhibits no inverted nipple, no mass, no nipple discharge, no skin change and no tenderness. Left breast exhibits no inverted nipple, no mass, no nipple discharge, no skin change and no tenderness.  Well healed incision at 9 o'clock.  Minimal thickening laterally.  Abdominal: Soft. Bowel  sounds are normal.  Lymphadenopathy:    She has no cervical adenopathy.  Neurological: She is alert and oriented to person, place, and time.  Skin: Skin is warm and dry.    Data Reviewed 09/18/2015 mammogram and ultrasound reviewed. BI-RADS-3.  Assessment    Benign breast exam status post ADH resection March 2015. Good tolerance of chemoprevention with Evista.    Plan    In fall consultation with GYN reported no absolute cut off for Pap smear testing. 3 year interval reasonable for those women in a monogamous relationship.  Radiologist recommendation for six-month follow-up reviewed. On my on review of the films I would recommend a one year follow-up. At this time that is the  patient's preference which I'm very comfortable.    Patient to return in one year with bilateral mammograms.  This information has been scribed by Verlene Mayer, CMA    PCP: Dr. Guy Sandifer, [place] 11/07/2015, 3:31 PM

## 2015-11-06 NOTE — Patient Instructions (Addendum)
Continue self breast exams. Call office for any new breast issues or concerns. Return in one year with bilateral diagnostic mammograms.

## 2015-11-07 ENCOUNTER — Encounter: Payer: Self-pay | Admitting: General Surgery

## 2015-12-14 ENCOUNTER — Other Ambulatory Visit: Payer: Self-pay | Admitting: Family Medicine

## 2015-12-14 DIAGNOSIS — R1033 Periumbilical pain: Secondary | ICD-10-CM

## 2015-12-19 ENCOUNTER — Other Ambulatory Visit: Payer: Self-pay | Admitting: Family Medicine

## 2015-12-19 DIAGNOSIS — R1033 Periumbilical pain: Secondary | ICD-10-CM

## 2015-12-21 ENCOUNTER — Ambulatory Visit: Payer: BLUE CROSS/BLUE SHIELD

## 2015-12-26 ENCOUNTER — Other Ambulatory Visit: Payer: BLUE CROSS/BLUE SHIELD

## 2015-12-26 ENCOUNTER — Ambulatory Visit
Admission: RE | Admit: 2015-12-26 | Discharge: 2015-12-26 | Disposition: A | Discharge status: Home / Self Care | Payer: BLUE CROSS/BLUE SHIELD | Source: Ambulatory Visit | Attending: Family Medicine | Admitting: Family Medicine

## 2015-12-26 DIAGNOSIS — R1033 Periumbilical pain: Secondary | ICD-10-CM

## 2015-12-26 MED ORDER — IOPAMIDOL (ISOVUE-300) INJECTION 61%
100.0000 mL | Freq: Once | INTRAVENOUS | Status: AC | PRN
  Administered 2015-12-26: 100 mL via INTRAVENOUS

## 2016-01-18 ENCOUNTER — Other Ambulatory Visit: Payer: BLUE CROSS/BLUE SHIELD

## 2016-01-22 ENCOUNTER — Encounter: Payer: Self-pay | Admitting: *Deleted

## 2016-01-22 ENCOUNTER — Inpatient Hospital Stay
Admission: RE | Admit: 2016-01-22 | Discharge: 2016-01-22 | Disposition: A | Discharge status: Home / Self Care | Payer: BLUE CROSS/BLUE SHIELD | Source: Ambulatory Visit

## 2016-01-22 NOTE — Pre-Procedure Instructions (Signed)
Component Name Value Range  LV Diastolic Diameter PLAX 5.2 cm  LV Systolic Diameter PLAX 3.8 cm  IVS Diastolic Thickness 0.9 cm  LVPW Diastolic Thickness 0.9 cm  LA Systolic Diameter LX 4.3 cm  LA Volume 144.1 cm3  LA Area 4C View 19.7 cm2  LA Area 2C View 25.6 cm2  Aortic Root Diameter 2.8 cm  AV Peak Velocity 2.16 m/s  AV Peak Gradient 18.66   PV Peak Velocity 1.3 m/s  PV peak gradient 6.76 mmHg   Aorta at Sinuses Diameter 2.8 cm   Result Narrative   Borderline left ventricular systolic function, ejection fraction 50 to  55%  Mitral regurgitation - mild  Dilated left atrium - mild  Aortic sclerosis  Normal right ventricular systolic function   Status Results Details   Encounter Summary  ECG 12 Lead12/30/2016  [facility]  ECG 12 Lead12/30/2016  [facility]  Component Name Value Range  EKG Ventricular Rate 82 BPM   EKG Atrial Rate 82 BPM   EKG P-R Interval 198 ms  EKG QRS Duration 152 ms  EKG Q-T Interval 436 ms  EKG QTC Calculation 509 ms  EKG Calculated P Axis 23 degrees   EKG Calculated R Axis -29 degrees   EKG Calculated T Axis 21 degrees    Result Narrative  SINUS RHYTHM WITH OCCASIONAL PREMATURE VENTRICULAR BEATS POSSIBLE LEFT ATRIAL ENLARGEMENT  LEFT BUNDLE BRANCH BLOCK ABNORMAL ECG NO PREVIOUS ECGS AVAILABLE Confirmed by MILLER  MD, PAULA ([id number]) on 07/08/2015 7:05:58 AM   Status Results Details   Encounter Summary

## 2016-01-22 NOTE — Patient Instructions (Signed)
  Your procedure is scheduled on: 01-25-16 Report to Same Day Surgery 2nd floor medical mall To find out your arrival time please call [phone] between 1PM - 3PM on 01-24-16  Remember: Instructions that are not followed completely may result in serious medical risk, up to and including death, or upon the discretion of your surgeon and anesthesiologist your surgery may need to be rescheduled.    _x___ 1. Do not eat food or drink liquids after midnight. No gum chewing or hard candies.     __x__ 2. No Alcohol for 24 hours before or after surgery.   __x__3. No Smoking for 24 prior to surgery.   ____  4. Bring all medications with you on the day of surgery if instructed.    __x__ 5. Notify your doctor if there is any change in your medical condition     (cold, fever, infections).     Do not wear jewelry, make-up, hairpins, clips or nail polish.  Do not wear lotions, powders, or perfumes. You may wear deodorant.  Do not shave 48 hours prior to surgery. Men may shave face and neck.  Do not bring valuables to the hospital.    [facility] is not responsible for any belongings or valuables.               Contacts, dentures or bridgework may not be worn into surgery.  Leave your suitcase in the car. After surgery it may be brought to your room.  For patients admitted to the hospital, discharge time is determined by your treatment team.   Patients discharged the day of surgery will not be allowed to drive home.    Please read over the following fact sheets that you were given:   [facility] Preparing for Surgery and or MRSA Information   ____ Take these medicines the morning of surgery with A SIP OF WATER:    1. NONE  2.  3.  4.  5.  6.  ____ Fleet Enema (as directed)   ____ Use CHG Soap or sage wipes as directed on instruction sheet   ____ Use inhalers on the day of surgery and bring to hospital day of surgery  ____ Stop metformin 2 days prior to surgery    ____ Take 1/2 of  usual insulin dose the night before surgery and none on the morning of  surgery.   ____ Stop aspirin or coumadin, or plavix  _x__ Stop Anti-inflammatories such as Advil, Aleve, Ibuprofen, Motrin, Naproxen,          Naprosyn, Goodies powders or aspirin products NOW- Ok to take Tylenol.   _X___ Stop supplements until after surgery-STOP GINGKO BILOBA, GLUCOSAMINE-CHONDROITIN, FISH OIL AND SAW PALMETTO NOW   ____ Bring C-Pap to the hospital.

## 2016-01-25 ENCOUNTER — Ambulatory Visit
Admission: RE | Admit: 2016-01-25 | Discharge: 2016-01-25 | Disposition: A | Discharge status: Home / Self Care | Payer: BLUE CROSS/BLUE SHIELD | Source: Ambulatory Visit | Attending: Surgery | Admitting: Surgery

## 2016-01-25 ENCOUNTER — Encounter: Payer: Self-pay | Admitting: *Deleted

## 2016-01-25 ENCOUNTER — Ambulatory Visit: Payer: BLUE CROSS/BLUE SHIELD | Admitting: Anesthesiology

## 2016-01-25 ENCOUNTER — Encounter
Admission: RE | Disposition: A | Discharge status: Home / Self Care | Payer: Self-pay | Source: Ambulatory Visit | Attending: Surgery

## 2016-01-25 DIAGNOSIS — Z9884 Bariatric surgery status: Secondary | ICD-10-CM | POA: Insufficient documentation

## 2016-01-25 DIAGNOSIS — E669 Obesity, unspecified: Secondary | ICD-10-CM | POA: Diagnosis not present

## 2016-01-25 DIAGNOSIS — I447 Left bundle-branch block, unspecified: Secondary | ICD-10-CM | POA: Diagnosis not present

## 2016-01-25 DIAGNOSIS — F329 Major depressive disorder, single episode, unspecified: Secondary | ICD-10-CM | POA: Diagnosis not present

## 2016-01-25 DIAGNOSIS — F419 Anxiety disorder, unspecified: Secondary | ICD-10-CM | POA: Insufficient documentation

## 2016-01-25 DIAGNOSIS — K439 Ventral hernia without obstruction or gangrene: Secondary | ICD-10-CM | POA: Insufficient documentation

## 2016-01-25 DIAGNOSIS — Z79899 Other long term (current) drug therapy: Secondary | ICD-10-CM | POA: Diagnosis not present

## 2016-01-25 DIAGNOSIS — Z9049 Acquired absence of other specified parts of digestive tract: Secondary | ICD-10-CM | POA: Insufficient documentation

## 2016-01-25 DIAGNOSIS — M1990 Unspecified osteoarthritis, unspecified site: Secondary | ICD-10-CM | POA: Insufficient documentation

## 2016-01-25 DIAGNOSIS — Z87891 Personal history of nicotine dependence: Secondary | ICD-10-CM | POA: Diagnosis not present

## 2016-01-25 HISTORY — PX: VENTRAL HERNIA REPAIR: SHX424

## 2016-01-25 SURGERY — REPAIR, HERNIA, VENTRAL
Anesthesia: General | Site: Abdomen | Wound class: Clean

## 2016-01-25 MED ORDER — MEPERIDINE HCL 25 MG/ML IJ SOLN: 6.2500 mg | INTRAMUSCULAR | Status: DC | PRN

## 2016-01-25 MED ORDER — CEFAZOLIN SODIUM-DEXTROSE 2-4 GM/100ML-% IV SOLN
2.0000 g | Freq: Once | INTRAVENOUS | Status: AC
  Administered 2016-01-25: 2 g via INTRAVENOUS

## 2016-01-25 MED ORDER — ONDANSETRON HCL 4 MG/2ML IJ SOLN
INTRAMUSCULAR | Status: DC | PRN
  Administered 2016-01-25 (×2): 4 mg via INTRAVENOUS

## 2016-01-25 MED ORDER — OXYCODONE HCL 5 MG PO TABS
5.0000 mg | ORAL_TABLET | Freq: Once | ORAL | Status: AC | PRN
  Administered 2016-01-25: 5 mg via ORAL

## 2016-01-25 MED ORDER — SUGAMMADEX SODIUM 200 MG/2ML IV SOLN
INTRAVENOUS | Status: DC | PRN
  Administered 2016-01-25: 154.2 mg via INTRAVENOUS

## 2016-01-25 MED ORDER — LIDOCAINE HCL (CARDIAC) 20 MG/ML IV SOLN
INTRAVENOUS | Status: DC | PRN
  Administered 2016-01-25: 100 mg via INTRAVENOUS

## 2016-01-25 MED ORDER — FENTANYL CITRATE (PF) 100 MCG/2ML IJ SOLN
INTRAMUSCULAR | Status: DC
Start: 2016-01-25 — End: 2016-01-25
  Filled 2016-01-25: qty 2

## 2016-01-25 MED ORDER — PROPOFOL 10 MG/ML IV BOLUS
INTRAVENOUS | Status: DC | PRN
  Administered 2016-01-25: 140 mg via INTRAVENOUS

## 2016-01-25 MED ORDER — DEXAMETHASONE SODIUM PHOSPHATE 10 MG/ML IJ SOLN
INTRAMUSCULAR | Status: DC | PRN
  Administered 2016-01-25: 10 mg via INTRAVENOUS

## 2016-01-25 MED ORDER — CEFAZOLIN SODIUM-DEXTROSE 2-4 GM/100ML-% IV SOLN
INTRAVENOUS | Status: AC
  Administered 2016-01-25: 2 g via INTRAVENOUS
  Filled 2016-01-25: qty 100

## 2016-01-25 MED ORDER — SUCCINYLCHOLINE CHLORIDE 20 MG/ML IJ SOLN
INTRAMUSCULAR | Status: DC | PRN
  Administered 2016-01-25: 100 mg via INTRAVENOUS

## 2016-01-25 MED ORDER — EPHEDRINE SULFATE 50 MG/ML IJ SOLN
INTRAMUSCULAR | Status: DC | PRN
  Administered 2016-01-25 (×3): 10 mg via INTRAVENOUS

## 2016-01-25 MED ORDER — OXYCODONE HCL 5 MG/5ML PO SOLN: 5.0000 mg | Freq: Once | ORAL | Status: AC | PRN

## 2016-01-25 MED ORDER — ROCURONIUM BROMIDE 100 MG/10ML IV SOLN
INTRAVENOUS | Status: DC | PRN
  Administered 2016-01-25: 40 mg via INTRAVENOUS
  Administered 2016-01-25 (×2): 10 mg via INTRAVENOUS

## 2016-01-25 MED ORDER — FENTANYL CITRATE (PF) 100 MCG/2ML IJ SOLN
25.0000 ug | INTRAMUSCULAR | Status: AC | PRN
  Administered 2016-01-25 (×6): 25 ug via INTRAVENOUS

## 2016-01-25 MED ORDER — FENTANYL CITRATE (PF) 100 MCG/2ML IJ SOLN
INTRAMUSCULAR | Status: AC
  Administered 2016-01-25: 25 ug via INTRAVENOUS
  Filled 2016-01-25: qty 2

## 2016-01-25 MED ORDER — PROMETHAZINE HCL 25 MG/ML IJ SOLN
6.2500 mg | INTRAMUSCULAR | Status: DC | PRN
Start: 2016-01-25 — End: 2016-01-25

## 2016-01-25 MED ORDER — BUPIVACAINE-EPINEPHRINE 0.5% -1:200000 IJ SOLN
INTRAMUSCULAR | Status: DC | PRN
  Administered 2016-01-25: 20 mL

## 2016-01-25 MED ORDER — HYDROCODONE-ACETAMINOPHEN 5-325 MG PO TABS: 1.0000 | ORAL_TABLET | ORAL | Status: DC | PRN

## 2016-01-25 MED ORDER — FENTANYL CITRATE (PF) 100 MCG/2ML IJ SOLN
INTRAMUSCULAR | Status: DC | PRN
  Administered 2016-01-25: 100 ug via INTRAVENOUS

## 2016-01-25 MED ORDER — KETOROLAC TROMETHAMINE 30 MG/ML IJ SOLN
INTRAMUSCULAR | Status: DC | PRN
  Administered 2016-01-25: 30 mg via INTRAVENOUS

## 2016-01-25 MED ORDER — BUPIVACAINE-EPINEPHRINE (PF) 0.5% -1:200000 IJ SOLN
INTRAMUSCULAR | Status: AC
  Filled 2016-01-25: qty 30

## 2016-01-25 MED ORDER — OXYCODONE HCL 5 MG PO TABS
ORAL_TABLET | ORAL | Status: AC
  Filled 2016-01-25: qty 1

## 2016-01-25 MED ORDER — LACTATED RINGERS IV SOLN
INTRAVENOUS | Status: DC
  Administered 2016-01-25 (×3): via INTRAVENOUS

## 2016-01-25 MED ORDER — MIDAZOLAM HCL 2 MG/2ML IJ SOLN
INTRAMUSCULAR | Status: DC | PRN
  Administered 2016-01-25: 2 mg via INTRAVENOUS

## 2016-01-25 SURGICAL SUPPLY — 27 items
CANISTER SUCT 1200ML W/VALVE (MISCELLANEOUS) ×2 IMPLANT
CHLORAPREP W/TINT 26ML (MISCELLANEOUS) ×2 IMPLANT
DRAPE LAPAROTOMY 100X77 ABD (DRAPES) ×2 IMPLANT
ELECT REM PT RETURN 9FT ADLT (ELECTROSURGICAL) ×2
ELECTRODE REM PT RTRN 9FT ADLT (ELECTROSURGICAL) ×1 IMPLANT
GAUZE SPONGE 4X4 12PLY STRL (GAUZE/BANDAGES/DRESSINGS) IMPLANT
GLOVE BIO SURGEON STRL SZ7.5 (GLOVE) ×2 IMPLANT
GOWN STRL REUS W/ TWL LRG LVL3 (GOWN DISPOSABLE) ×3 IMPLANT
GOWN STRL REUS W/TWL LRG LVL3 (GOWN DISPOSABLE) ×3
KIT RM TURNOVER STRD PROC AR (KITS) ×2 IMPLANT
LABEL OR SOLS (LABEL) ×2 IMPLANT
LIQUID BAND (GAUZE/BANDAGES/DRESSINGS) ×2 IMPLANT
MESH BIO-A  8X8 SYN MATR (Mesh General) ×1 IMPLANT
MESH BIO-A 7X10 SYN MAT (Mesh General) IMPLANT
MESH BIO-A 8X8 SYN MATR (Mesh General) ×1 IMPLANT
NEEDLE HYPO 25X1 1.5 SAFETY (NEEDLE) ×2 IMPLANT
NS IRRIG 500ML POUR BTL (IV SOLUTION) ×2 IMPLANT
PACK BASIN MINOR ARMC (MISCELLANEOUS) ×2 IMPLANT
SPONGE LAP 18X18 5 PK (GAUZE/BANDAGES/DRESSINGS) ×2 IMPLANT
STAPLER SKIN PROX 35W (STAPLE) IMPLANT
SUT CHROMIC 3 0 SH 27 (SUTURE) ×2 IMPLANT
SUT MAXON ABS #0 GS21 30IN (SUTURE) ×6 IMPLANT
SUT MNCRL 4-0 (SUTURE) ×1
SUT MNCRL 4-0 27XMFL (SUTURE) ×1
SUT SURGILON 0 30 BLK (SUTURE) ×6 IMPLANT
SUTURE MNCRL 4-0 27XMF (SUTURE) ×1 IMPLANT
SYRINGE 10CC LL (SYRINGE) ×2 IMPLANT

## 2016-01-25 NOTE — Transfer of Care (Signed)
Immediate Anesthesia Transfer of Care Note  Patient: Julia Sandoval  Procedure(s) Performed: Procedure(s): HERNIA REPAIR VENTRAL ADULT (N/A)  Patient Location: PACU  Anesthesia Type:General  Level of Consciousness: sedated  Airway & Oxygen Therapy: Patient Spontanous Breathing and Patient connected to face mask oxygen  Post-op Assessment: Report given to RN and Post -op Vital signs reviewed and stable  Post vital signs: Reviewed and stable  Last Vitals:  Filed Vitals:   01/25/16 0853  BP: 155/71  Pulse: 73  Temp: 36.8 C  Resp: 16    Last Pain: There were no vitals filed for this visit.       Complications: No apparent anesthesia complications

## 2016-01-25 NOTE — Discharge Instructions (Signed)
Take Tylenol or Norco if needed for pain.  May shower and blot dry.  Gradually increase walking. Avoid straining and heavy lifting.   AMBULATORY SURGERY  DISCHARGE INSTRUCTIONS   1) The drugs that you were given will stay in your system until tomorrow so for the next 24 hours you should not:  A) Drive an automobile B) Make any legal decisions C) Drink any alcoholic beverage   2) You may resume regular meals tomorrow.  Today it is better to start with liquids and gradually work up to solid foods.  You may eat anything you prefer, but it is better to start with liquids, then soup and crackers, and gradually work up to solid foods.   3) Please notify your doctor immediately if you have any unusual bleeding, trouble breathing, redness and pain at the surgery site, drainage, fever, or pain not relieved by medication.    4) Additional Instructions:    Please contact your physician with any problems or Same Day Surgery at [phone], Monday through Friday 6 am to 4 pm, or [place] at [facility] number at [phone].

## 2016-01-25 NOTE — Anesthesia Preprocedure Evaluation (Addendum)
Anesthesia Evaluation  Patient identified by MRN, date of birth, ID band Patient awake    Reviewed: Allergy & Precautions, NPO status , Patient's Chart, lab work & pertinent test results  History of Anesthesia Complications Negative for: history of anesthetic complications  Airway Mallampati: II  TM Distance: >3 FB Neck ROM: Full    Dental  (+) Implants, Missing   Pulmonary neg COPD, former smoker,    breath sounds clear to auscultation- rhonchi (-) wheezing      Cardiovascular Exercise Tolerance: Good hypertension (not currently on medications), (-) CAD and (-) Past MI  Rhythm:Regular Rate:Normal - Systolic murmurs and - Diastolic murmurs LBBB   Neuro/Psych Depression    GI/Hepatic negative GI ROS, Neg liver ROS, History of gastric bypass surgery    Endo/Other  negative endocrine ROSneg diabetes  Renal/GU negative Renal ROS     Musculoskeletal negative musculoskeletal ROS (+)   Abdominal (+) - obese,   Peds  Hematology negative hematology ROS (+)   Anesthesia Other Findings Past Medical History:   Obesity                                                      Hypertension                                                   Comment:Unspecified   Hyperlipidemia                                                 Comment:Mixed   LBBB (left bundle branch block)                              Depression                                                   Reproductive/Obstetrics                            Anesthesia Physical Anesthesia Plan  ASA: II  Anesthesia Plan: General   Post-op Pain Management:    Induction: Intravenous  Airway Management Planned: Oral ETT  Additional Equipment:   Intra-op Plan:   Post-operative Plan: Extubation in OR  Informed Consent: I have reviewed the patients History and Physical, chart, labs and discussed the procedure including the risks, benefits and  alternatives for the proposed anesthesia with the patient or authorized representative who has indicated his/her understanding and acceptance.   Dental advisory given  Plan Discussed with:   Anesthesia Plan Comments:         Anesthesia Quick Evaluation

## 2016-01-25 NOTE — Op Note (Signed)
OPERATIVE REPORT  PREOPERATIVE  DIAGNOSIS: . Ventral hernia  POSTOPERATIVE DIAGNOSIS: . Ventral hernia  PROCEDURE: . Ventral hernia repair  ANESTHESIA:  General  SURGEON: Rochel Brome  MD   INDICATIONS: . She has had recent bulging and pain in the mid aspect of the abdomen. She has had previous laparotomy for common duct exploration and repair of perforation of the intestine. A ventral hernia was demonstrated on physical exam with approximately 3-4 cm fascial defect at the mid aspect of the epigastric incision. Surgery was recommended for definitive treatment. She did have a preop dose of 2 g of Kefzol IV.  With the patient on the operating table in the supine position she was placed under general endotracheal anesthesia. The abdomen was prepared with ChloraPrep and draped in a sterile manner. A longitudinally oriented incision was made in the old scar proximally 7 cm in length and carried down through subcutaneous tissues. Several bleeding points were cauterized. There was a ventral hernia sac which was dissected free from surrounding structures and did find a large amount of scar tissue and distorted anatomy. The sac was dissected away from the fascial ring defect. Properitoneal fat was also separated from the fascial ring defect. Noted that during the course of dissection there was one tiny pocket of purulent debris which appeared that it might be a suture granuloma.. Some of the fascia superior to the defect appeared to be somewhat thin due to diastasis in therefore a larger portion of mesh was selected. There was some suture material which appeared very monofilament which was removed. Gore-Tex bio a mesh was cut to create an oval shape of some 4 x 8 cm. This was placed into the properitoneal plane oriented longitudinally. It was sutured to the overlying fascia with 0 Maxon through and through sutures. The fascial defect was then closed with a longitudinally oriented suture line of interrupted 0  Maxon simple sutures incorporating each suture into the mesh. Hemostasis was intact. The skin was closed with running 4-0 Monocryl subcuticular suture and LiquiBand.  The patient appeared to tolerate the procedure satisfactorily and was prepared for transfer to the recovery room  [facility].D.

## 2016-01-25 NOTE — Anesthesia Postprocedure Evaluation (Signed)
Anesthesia Post Note  Patient: Julia Sandoval  Procedure(s) Performed: Procedure(s) (LRB): HERNIA REPAIR VENTRAL ADULT (N/A)  Patient location during evaluation: PACU Anesthesia Type: General Level of consciousness: awake and alert Pain management: pain level controlled Vital Signs Assessment: post-procedure vital signs reviewed and stable Respiratory status: spontaneous breathing, nonlabored ventilation, respiratory function stable and patient connected to nasal cannula oxygen Cardiovascular status: blood pressure returned to baseline and stable Postop Assessment: no signs of nausea or vomiting Anesthetic complications: no    Last Vitals:  Filed Vitals:   01/25/16 1219 01/25/16 1224  BP:    Pulse: 75 69  Temp:    Resp: 12 11    Last Pain:  Filed Vitals:   01/25/16 1234  PainSc: 5                  Harolyn Cocker

## 2016-01-25 NOTE — Anesthesia Procedure Notes (Signed)
Procedure Name: Intubation Date/Time: 01/25/2016 9:40 AM Performed by: Nelda Marseille Pre-anesthesia Checklist: Patient identified, Patient being monitored, Timeout performed, Emergency Drugs available and Suction available Patient Re-evaluated:Patient Re-evaluated prior to inductionOxygen Delivery Method: Circle system utilized Preoxygenation: Pre-oxygenation with 100% oxygen Intubation Type: IV induction Ventilation: Mask ventilation without difficulty Laryngoscope Size: Mac and 3 Grade View: Grade I Tube type: Oral Tube size: 7.0 mm Number of attempts: 1 Airway Equipment and Method: Stylet Placement Confirmation: ETT inserted through vocal cords under direct vision,  positive ETCO2 and breath sounds checked- equal and bilateral Secured at: 21 cm Tube secured with: Tape Dental Injury: Teeth and Oropharynx as per pre-operative assessment

## 2016-01-25 NOTE — H&P (Signed)
  She reports no change in overall condition since the office visit.  The abdomen was examined demonstrating the location of the ventral hernia.  Lab work noted  I discussed the plan for ventral hernia repair

## 2016-01-25 NOTE — OR Nursing (Signed)
Dr. Tamala Julian here.  [place] home.

## 2016-03-31 ENCOUNTER — Emergency Department: Payer: BLUE CROSS/BLUE SHIELD | Admitting: Certified Registered Nurse Anesthetist

## 2016-03-31 ENCOUNTER — Encounter: Payer: Self-pay | Admitting: Emergency Medicine

## 2016-03-31 ENCOUNTER — Encounter
Admission: EM | Disposition: A | Discharge status: Home / Self Care | Payer: Self-pay | Source: Home / Self Care | Attending: Internal Medicine

## 2016-03-31 ENCOUNTER — Inpatient Hospital Stay: Payer: BLUE CROSS/BLUE SHIELD

## 2016-03-31 ENCOUNTER — Inpatient Hospital Stay
Admission: EM | Admit: 2016-03-31 | Discharge: 2016-04-03 | DRG: 690 | Disposition: A | Discharge status: Home / Self Care | Payer: BLUE CROSS/BLUE SHIELD | Attending: Internal Medicine | Admitting: Internal Medicine

## 2016-03-31 ENCOUNTER — Emergency Department: Payer: BLUE CROSS/BLUE SHIELD

## 2016-03-31 DIAGNOSIS — Z23 Encounter for immunization: Secondary | ICD-10-CM | POA: Diagnosis not present

## 2016-03-31 DIAGNOSIS — N201 Calculus of ureter: Secondary | ICD-10-CM

## 2016-03-31 DIAGNOSIS — N136 Pyonephrosis: Secondary | ICD-10-CM | POA: Diagnosis present

## 2016-03-31 DIAGNOSIS — N132 Hydronephrosis with renal and ureteral calculous obstruction: Secondary | ICD-10-CM

## 2016-03-31 DIAGNOSIS — F329 Major depressive disorder, single episode, unspecified: Secondary | ICD-10-CM | POA: Diagnosis present

## 2016-03-31 DIAGNOSIS — Z820 Family history of epilepsy and other diseases of the nervous system: Secondary | ICD-10-CM | POA: Diagnosis not present

## 2016-03-31 DIAGNOSIS — E669 Obesity, unspecified: Secondary | ICD-10-CM | POA: Diagnosis present

## 2016-03-31 DIAGNOSIS — N179 Acute kidney failure, unspecified: Secondary | ICD-10-CM | POA: Diagnosis present

## 2016-03-31 DIAGNOSIS — Z9884 Bariatric surgery status: Secondary | ICD-10-CM

## 2016-03-31 DIAGNOSIS — I447 Left bundle-branch block, unspecified: Secondary | ICD-10-CM | POA: Diagnosis present

## 2016-03-31 DIAGNOSIS — E782 Mixed hyperlipidemia: Secondary | ICD-10-CM | POA: Diagnosis present

## 2016-03-31 DIAGNOSIS — Z87891 Personal history of nicotine dependence: Secondary | ICD-10-CM | POA: Diagnosis not present

## 2016-03-31 DIAGNOSIS — Z823 Family history of stroke: Secondary | ICD-10-CM

## 2016-03-31 DIAGNOSIS — Z8249 Family history of ischemic heart disease and other diseases of the circulatory system: Secondary | ICD-10-CM | POA: Diagnosis not present

## 2016-03-31 DIAGNOSIS — A419 Sepsis, unspecified organism: Secondary | ICD-10-CM

## 2016-03-31 DIAGNOSIS — R1032 Left lower quadrant pain: Secondary | ICD-10-CM | POA: Diagnosis not present

## 2016-03-31 DIAGNOSIS — E876 Hypokalemia: Secondary | ICD-10-CM | POA: Diagnosis not present

## 2016-03-31 DIAGNOSIS — N12 Tubulo-interstitial nephritis, not specified as acute or chronic: Secondary | ICD-10-CM

## 2016-03-31 DIAGNOSIS — Z6828 Body mass index (BMI) 28.0-28.9, adult: Secondary | ICD-10-CM

## 2016-03-31 DIAGNOSIS — B961 Klebsiella pneumoniae [K. pneumoniae] as the cause of diseases classified elsewhere: Secondary | ICD-10-CM | POA: Diagnosis present

## 2016-03-31 DIAGNOSIS — N200 Calculus of kidney: Secondary | ICD-10-CM

## 2016-03-31 DIAGNOSIS — N10 Acute tubulo-interstitial nephritis: Secondary | ICD-10-CM | POA: Diagnosis present

## 2016-03-31 DIAGNOSIS — I10 Essential (primary) hypertension: Secondary | ICD-10-CM | POA: Diagnosis present

## 2016-03-31 DIAGNOSIS — D72829 Elevated white blood cell count, unspecified: Secondary | ICD-10-CM | POA: Diagnosis not present

## 2016-03-31 DIAGNOSIS — R112 Nausea with vomiting, unspecified: Secondary | ICD-10-CM | POA: Diagnosis not present

## 2016-03-31 HISTORY — PX: CYSTOSCOPY W/ RETROGRADES: SHX1426

## 2016-03-31 HISTORY — PX: CYSTOSCOPY WITH STENT PLACEMENT: SHX5790

## 2016-03-31 LAB — BASIC METABOLIC PANEL
Anion gap: 9 (ref 5–15)
BUN: 15 mg/dL (ref 6–20)
CHLORIDE: 105 mmol/L (ref 101–111)
CO2: 22 mmol/L (ref 22–32)
Calcium: 9.1 mg/dL (ref 8.9–10.3)
Creatinine, Ser: 1.06 mg/dL — ABNORMAL HIGH (ref 0.44–1.00)
GFR calc Af Amer: >60 mL/min (ref >60)
GFR calc non Af Amer: 52 mL/min — ABNORMAL LOW (ref >60)
Glucose, Bld: 175 mg/dL — ABNORMAL HIGH (ref 65–99)
POTASSIUM: 4 mmol/L (ref 3.5–5.1)
SODIUM: 136 mmol/L (ref 135–145)

## 2016-03-31 LAB — GRAM STAIN

## 2016-03-31 LAB — CBC
HEMATOCRIT: 38.5 % (ref 35.0–47.0)
Hemoglobin: 13.3 g/dL (ref 12.0–16.0)
MCH: 29.6 pg (ref 26.0–34.0)
MCHC: 34.7 g/dL (ref 32.0–36.0)
MCV: 85.5 fL (ref 80.0–100.0)
Platelets: 260 10*3/uL (ref 150–440)
RBC: 4.5 MIL/uL (ref 3.80–5.20)
RDW: 13.2 % (ref 11.5–14.5)
WBC: 11.5 10*3/uL — AB (ref 3.6–11.0)

## 2016-03-31 LAB — HEPATIC FUNCTION PANEL
ALK PHOS: 66 U/L (ref 38–126)
ALT: 20 U/L (ref 14–54)
AST: 29 U/L (ref 15–41)
Albumin: 3.3 g/dL — ABNORMAL LOW (ref 3.5–5.0)
Bilirubin, Direct: 0.2 mg/dL (ref 0.1–0.5)
Indirect Bilirubin: 1 mg/dL — ABNORMAL HIGH (ref 0.3–0.9)
Total Bilirubin: 1.2 mg/dL (ref 0.3–1.2)
Total Protein: 7.2 g/dL (ref 6.5–8.1)

## 2016-03-31 LAB — URINALYSIS COMPLETE WITH MICROSCOPIC (ARMC ONLY)
BILIRUBIN URINE: NEGATIVE
GLUCOSE, UA: NEGATIVE mg/dL
NITRITE: POSITIVE — AB
Protein, ur: 100 mg/dL — AB
SPECIFIC GRAVITY, URINE: 1.016 (ref 1.005–1.030)
pH: 7 (ref 5.0–8.0)

## 2016-03-31 LAB — DIFFERENTIAL
BASOS PCT: 0 %
Basophils Absolute: 0 10*3/uL (ref 0–0.1)
EOS PCT: 0 %
Eosinophils Absolute: 0 10*3/uL (ref 0–0.7)
LYMPHS PCT: 3 %
Lymphs Abs: 0.3 10*3/uL — ABNORMAL LOW (ref 1.0–3.6)
MONO ABS: 0.5 10*3/uL (ref 0.2–0.9)
MONOS PCT: 4 %
NEUTROS ABS: 10.9 10*3/uL — AB (ref 1.4–6.5)
Neutrophils Relative %: 93 %

## 2016-03-31 SURGERY — CYSTOSCOPY, WITH RETROGRADE PYELOGRAM
Anesthesia: General

## 2016-03-31 MED ORDER — MORPHINE SULFATE (PF) 2 MG/ML IV SOLN: 1.0000 mg | INTRAVENOUS | Status: DC | PRN

## 2016-03-31 MED ORDER — QUETIAPINE FUMARATE 100 MG PO TABS
100.0000 mg | ORAL_TABLET | Freq: Every day | ORAL | Status: DC
Start: 2016-03-31 — End: 2016-04-03
  Administered 2016-03-31 – 2016-04-02 (×3): 100 mg via ORAL
  Filled 2016-03-31 (×3): qty 1

## 2016-03-31 MED ORDER — ENOXAPARIN SODIUM 40 MG/0.4ML ~~LOC~~ SOLN
40.0000 mg | SUBCUTANEOUS | Status: DC
  Administered 2016-03-31 – 2016-04-02 (×3): 40 mg via SUBCUTANEOUS
  Filled 2016-03-31 (×3): qty 0.4

## 2016-03-31 MED ORDER — ACETAMINOPHEN 650 MG RE SUPP: 650.0000 mg | Freq: Four times a day (QID) | RECTAL | Status: DC | PRN

## 2016-03-31 MED ORDER — CEFAZOLIN IN D5W 1 GM/50ML IV SOLN
INTRAVENOUS | Status: AC
  Administered 2016-03-31: 1 g
  Filled 2016-03-31: qty 50

## 2016-03-31 MED ORDER — ACETAMINOPHEN 10 MG/ML IV SOLN
INTRAVENOUS | Status: AC
  Filled 2016-03-31: qty 100

## 2016-03-31 MED ORDER — ONDANSETRON HCL 4 MG/2ML IJ SOLN
INTRAMUSCULAR | Status: DC | PRN
  Administered 2016-03-31: 4 mg via INTRAVENOUS

## 2016-03-31 MED ORDER — PROMETHAZINE HCL 25 MG/ML IJ SOLN
12.5000 mg | Freq: Once | INTRAMUSCULAR | Status: AC
  Administered 2016-03-31: 12.5 mg via INTRAVENOUS
  Filled 2016-03-31 (×2): qty 1

## 2016-03-31 MED ORDER — LIDOCAINE HCL (CARDIAC) 20 MG/ML IV SOLN
INTRAVENOUS | Status: DC | PRN
  Administered 2016-03-31: 50 mg via INTRAVENOUS

## 2016-03-31 MED ORDER — PNEUMOCOCCAL VAC POLYVALENT 25 MCG/0.5ML IJ INJ
0.5000 mL | INJECTION | INTRAMUSCULAR | Status: AC
  Administered 2016-04-01: 0.5 mL via INTRAMUSCULAR
  Filled 2016-03-31: qty 0.5

## 2016-03-31 MED ORDER — DEXAMETHASONE SODIUM PHOSPHATE 10 MG/ML IJ SOLN
INTRAMUSCULAR | Status: DC | PRN
  Administered 2016-03-31: 10 mg via INTRAVENOUS

## 2016-03-31 MED ORDER — SODIUM CHLORIDE 0.9 % IV SOLN
INTRAVENOUS | Status: DC
  Administered 2016-03-31 – 2016-04-02 (×5): via INTRAVENOUS

## 2016-03-31 MED ORDER — ADULT MULTIVITAMIN W/MINERALS CH
1.0000 | ORAL_TABLET | ORAL | Status: DC
  Administered 2016-04-02 – 2016-04-03 (×2): 1 via ORAL
  Filled 2016-03-31 (×2): qty 1

## 2016-03-31 MED ORDER — FENTANYL CITRATE (PF) 100 MCG/2ML IJ SOLN: 25.0000 ug | INTRAMUSCULAR | Status: DC | PRN

## 2016-03-31 MED ORDER — ONDANSETRON HCL 4 MG PO TABS: 4.0000 mg | ORAL_TABLET | Freq: Four times a day (QID) | ORAL | Status: DC | PRN

## 2016-03-31 MED ORDER — DEXTROSE 5 % IV SOLN: 1.0000 g | Freq: Once | INTRAVENOUS | Status: AC

## 2016-03-31 MED ORDER — IOTHALAMATE MEGLUMINE 43 % IV SOLN
INTRAVENOUS | Status: DC | PRN
  Administered 2016-03-31: 10 mL

## 2016-03-31 MED ORDER — VITAMIN D 1000 UNITS PO TABS
5000.0000 [IU] | ORAL_TABLET | ORAL | Status: DC
  Administered 2016-04-02 – 2016-04-03 (×2): 5000 [IU] via ORAL
  Filled 2016-03-31 (×2): qty 5

## 2016-03-31 MED ORDER — RALOXIFENE HCL 60 MG PO TABS
60.0000 mg | ORAL_TABLET | Freq: Every day | ORAL | Status: DC
  Administered 2016-03-31 – 2016-04-03 (×4): 60 mg via ORAL
  Filled 2016-03-31 (×4): qty 1

## 2016-03-31 MED ORDER — SODIUM CHLORIDE 0.9 % IV BOLUS (SEPSIS)
1000.0000 mL | Freq: Once | INTRAVENOUS | Status: AC
  Administered 2016-03-31: 1000 mL via INTRAVENOUS

## 2016-03-31 MED ORDER — ACETAMINOPHEN 325 MG PO TABS
650.0000 mg | ORAL_TABLET | Freq: Four times a day (QID) | ORAL | Status: DC | PRN
  Administered 2016-04-01 – 2016-04-02 (×2): 650 mg via ORAL
  Filled 2016-03-31 (×2): qty 2

## 2016-03-31 MED ORDER — BUPROPION HCL ER (XL) 300 MG PO TB24
300.0000 mg | ORAL_TABLET | ORAL | Status: DC
  Administered 2016-04-01 – 2016-04-03 (×3): 300 mg via ORAL
  Filled 2016-03-31 (×3): qty 1

## 2016-03-31 MED ORDER — INFLUENZA VAC SPLIT QUAD 0.5 ML IM SUSY
0.5000 mL | PREFILLED_SYRINGE | INTRAMUSCULAR | Status: AC
  Administered 2016-04-01: 0.5 mL via INTRAMUSCULAR
  Filled 2016-03-31: qty 0.5

## 2016-03-31 MED ORDER — DEXTROSE 5 % IV SOLN
1.0000 g | Freq: Once | INTRAVENOUS | Status: AC
  Administered 2016-03-31: 1 g via INTRAVENOUS
  Filled 2016-03-31: qty 10

## 2016-03-31 MED ORDER — ONDANSETRON HCL 4 MG/2ML IJ SOLN: 4.0000 mg | Freq: Once | INTRAMUSCULAR | Status: DC | PRN

## 2016-03-31 MED ORDER — LACTATED RINGERS IV SOLN
INTRAVENOUS | Status: DC | PRN
  Administered 2016-03-31: 17:00:00 via INTRAVENOUS

## 2016-03-31 MED ORDER — SUCCINYLCHOLINE CHLORIDE 20 MG/ML IJ SOLN
INTRAMUSCULAR | Status: DC | PRN
  Administered 2016-03-31: 100 mg via INTRAVENOUS

## 2016-03-31 MED ORDER — FENTANYL CITRATE (PF) 100 MCG/2ML IJ SOLN
INTRAMUSCULAR | Status: DC | PRN
  Administered 2016-03-31 (×2): 25 ug via INTRAVENOUS
  Administered 2016-03-31: 50 ug via INTRAVENOUS

## 2016-03-31 MED ORDER — ACETAMINOPHEN 10 MG/ML IV SOLN
INTRAVENOUS | Status: DC | PRN
  Administered 2016-03-31: 1000 mg via INTRAVENOUS

## 2016-03-31 MED ORDER — PROPOFOL 10 MG/ML IV BOLUS
INTRAVENOUS | Status: DC | PRN
  Administered 2016-03-31: 50 mg via INTRAVENOUS
  Administered 2016-03-31: 200 mg via INTRAVENOUS

## 2016-03-31 MED ORDER — DEXTROSE 5 % IV SOLN
1.0000 g | INTRAVENOUS | Status: DC
  Administered 2016-04-01 – 2016-04-02 (×2): 1 g via INTRAVENOUS
  Filled 2016-03-31 (×3): qty 10

## 2016-03-31 MED ORDER — GLUCOSAMINE-CHONDROITIN 500-400 MG PO TABS: 1.0000 | ORAL_TABLET | Freq: Two times a day (BID) | ORAL | Status: DC

## 2016-03-31 MED ORDER — ONDANSETRON HCL 4 MG/2ML IJ SOLN: 4.0000 mg | Freq: Four times a day (QID) | INTRAMUSCULAR | Status: DC | PRN

## 2016-03-31 SURGICAL SUPPLY — 23 items
BAG DRAIN CYSTO-URO LG1000N (MISCELLANEOUS) ×3 IMPLANT
CATH URETL 5X70 OPEN END (CATHETERS) ×3 IMPLANT
CONRAY 43 FOR UROLOGY 50M (MISCELLANEOUS) ×3 IMPLANT
DRAPE UTILITY 15X26 TOWEL STRL (DRAPES) ×3 IMPLANT
GLOVE BIO SURGEON STRL SZ 6.5 (GLOVE) IMPLANT
GLOVE BIO SURGEON STRL SZ7 (GLOVE) IMPLANT
GLOVE BIO SURGEON STRL SZ8 (GLOVE) ×3 IMPLANT
GOWN STRL REUS W/ TWL LRG LVL3 (GOWN DISPOSABLE) ×4 IMPLANT
GOWN STRL REUS W/TWL LRG LVL3 (GOWN DISPOSABLE) ×2
GUIDEWIRE STR ZIPWIRE 035X150 (MISCELLANEOUS) ×3 IMPLANT
KIT RM TURNOVER CYSTO AR (KITS) ×3 IMPLANT
PACK CYSTO AR (MISCELLANEOUS) ×3 IMPLANT
PREP PVP WINGED SPONGE (MISCELLANEOUS) ×3 IMPLANT
SENSORWIRE 0.038 NOT ANGLED (WIRE)
SET CYSTO W/LG BORE CLAMP LF (SET/KITS/TRAYS/PACK) ×3 IMPLANT
SOL .9 NS 3000ML IRR  AL (IV SOLUTION) ×1
SOL .9 NS 3000ML IRR UROMATIC (IV SOLUTION) ×2 IMPLANT
STENT URET 6FRX24 CONTOUR (STENTS) ×3 IMPLANT
STENT URET 6FRX26 CONTOUR (STENTS) IMPLANT
SURGILUBE 2OZ TUBE FLIPTOP (MISCELLANEOUS) ×3 IMPLANT
SYRINGE IRR TOOMEY STRL 70CC (SYRINGE) ×3 IMPLANT
WATER STERILE IRR 1000ML POUR (IV SOLUTION) ×3 IMPLANT
WIRE SENSOR 0.038 NOT ANGLED (WIRE) IMPLANT

## 2016-03-31 NOTE — Progress Notes (Signed)
PHARMACIST - PHYSICIAN ORDER COMMUNICATION  CONCERNING: P&T Medication Policy on Herbal Medications  DESCRIPTION:  This patient's order for:  Glucoasamine-chondroitin  has been noted.  This product(s) is classified as an "herbal" or natural product. Due to a lack of definitive safety studies or FDA approval, nonstandard manufacturing practices, plus the potential risk of unknown drug-drug interactions while on inpatient medications, the Pharmacy and Therapeutics Committee does not permit the use of "herbal" or natural products of this type within [place].   ACTION TAKEN: The pharmacy department is unable to verify this order at this time and your patient has been informed of this safety policy. Please reevaluate patient's clinical condition at discharge and address if the herbal or natural product(s) should be resumed at that time.   

## 2016-03-31 NOTE — ED Provider Notes (Signed)
[facility] Emergency Department Provider Note    First MD Initiated Contact with Patient 03/31/16 1312     (approximate)  I have reviewed the triage vital signs and the nursing notes.   HISTORY  Chief Complaint Flank Pain and Back Pain    HPI Julia Sandoval is a 70 y.o. female presents with 24 hours of worsening left flank pain and low back pain. Currently describes the pain as a throbbing and aching pain without radiation. Rates it as a 7 out of 10 in severity. This is associated with chills and feeling febrile. Also complains of diffuse myalgias. Denies any dysuria or change in the odor. Does have a history of nephrolithiasis. She does have a history of cholelithiasis status post cholecystectomy. Does feel nauseated but has not vomited. Denies any diarrhea or blood in her stool. Denies any chest pain or shortness of breath.   Past Medical History:  Diagnosis Date  . Depression   . Hyperlipidemia    Mixed  . Hypertension    Unspecified  . LBBB (left bundle branch block)   . Obesity     Patient Active Problem List   Diagnosis Date Noted  . Atypical ductal hyperplasia of right breast 10/20/2013  . 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 Flat epithelial atypia of right breast 08/10/2013  . HYPERLIPIDEMIA-MIXED 12/12/2008  . OVERWEIGHT/OBESITY 12/12/2008  . HYPERTENSION, UNSPECIFIED 12/12/2008  . LBBB 12/12/2008  . DYSPNEA 12/12/2008    Past Surgical History:  Procedure Laterality Date  . ANKLE FRACTURE SURGERY    . BREAST BIOPSY Right    neg bx  . BREAST SURGERY Right 07/04/2013   Stereotactic biopsy for asymmetric density and calcifications: Flat epithelial atypia, intraductal papilloma, fragmented.  Marland Kitchen BREAST SURGERY Right 09/06/13   Right breast wide excision, [facility] only  . CESAREAN SECTION    . CHOLECYSTECTOMY    . COLONOSCOPY  2013  . DILATION AND CURETTAGE OF UTERUS  2015  . GASTRIC BYPASS  2012  . SMALL INTESTINE SURGERY   07/2015   Accenditial cut-[facility]  . VENTRAL HERNIA REPAIR N/A 01/25/2016   Procedure: HERNIA REPAIR VENTRAL ADULT;  Surgeon: Leonie Green, MD;  Location: [facility] ORS;  Service: General;  Laterality: N/A;    Prior to Admission medications   Medication Sig Start Date End Date Taking? Authorizing Provider  buPROPion (WELLBUTRIN XL) 150 MG 24 hr tablet Take 300 mg by mouth every morning.  09/01/13   Historical Provider, MD  Cholecalciferol (VITAMIN D3) 5000 units CAPS Take 1 capsule by mouth daily.    Historical Provider, MD  Ginkgo Biloba (GINKGO PO) Take 1 tablet by mouth daily.    Historical Provider, MD  glucosamine-chondroitin 500-400 MG tablet Take 1 tablet by mouth 2 (two) times daily.    Historical Provider, MD  HYDROcodone-acetaminophen (NORCO) 5-325 MG tablet Take 1-2 tablets by mouth every 4 (four) hours as needed for moderate pain. 01/25/16   Leonie Green, MD  ibuprofen (ADVIL,MOTRIN) 600 MG tablet Take 600 mg by mouth every 6 (six) hours as needed. Pain 12/21/15   Historical Provider, MD  Multiple Vitamin (MULTIVITAMIN) tablet Take 1 tablet by mouth daily.    Historical Provider, MD  Omega-3 Fatty Acids (FISH OIL PO) Take 1 capsule by mouth 2 (two) times daily.    Historical Provider, MD  QUEtiapine (SEROQUEL) 100 MG tablet Take 100 mg by mouth at bedtime.    Historical Provider, MD  raloxifene (EVISTA) 60 MG tablet Take 1  tablet (60 mg total) by mouth daily. 11/06/15   Robert Bellow, MD  saw palmetto 160 MG capsule Take 160 mg by mouth daily.    Historical Provider, MD    Allergies Review of patient's allergies indicates no known allergies.  Family History  Problem Relation Age of Onset  . Stroke Other   . Breast cancer Neg Hx     Social History Social History  Substance Use Topics  . Smoking status: Former Smoker    Packs/day: 0.50    Years: 10.00    Types: Cigarettes    Quit date: 07/07/1998  . Smokeless tobacco: Never Used  . Alcohol use No    Review of  Systems Patient denies headaches, rhinorrhea, blurry vision, numbness, shortness of breath, chest pain, edema, cough, abdominal pain, nausea, vomiting, diarrhea, dysuria, fevers, rashes or hallucinations unless otherwise stated above in HPI. ____________________________________________   PHYSICAL EXAM:  VITAL SIGNS: Vitals:   03/31/16 1241  BP: (!) 161/98  Pulse: 96  Resp: 20  Temp: 99.1 F (37.3 C)   Constitutional: Alert and oriented. Well appearing and in no acute distress. Eyes: Conjunctivae are normal. PERRL. EOMI. Head: Atraumatic. Nose: No congestion/rhinnorhea. Mouth/Throat: Mucous membranes are moist.  Oropharynx non-erythematous. Neck: No stridor. Painless ROM. No cervical spine tenderness to palpation Hematological/Lymphatic/Immunilogical: No cervical lymphadenopathy. Cardiovascular: Normal rate, regular rhythm. Grossly normal heart sounds.  Good peripheral circulation. Respiratory: Normal respiratory effort.  No retractions. Lungs CTAB. Gastrointestinal: Soft and nontender. No distention. No abdominal bruits.+ left cva ttp Genitourinary:  Musculoskeletal: No lower extremity tenderness nor edema.  No joint effusions. Neurologic:  Normal speech and language. No gross focal neurologic deficits are appreciated. No gait instability. Skin:  Skin is warm, dry and intact. No rash noted. Psychiatric: Mood and affect are normal. Speech and behavior are normal.  ____________________________________________   LABS (all labs ordered are listed, but only abnormal results are displayed)  Results for orders placed or performed during the hospital encounter of 03/31/16 (from the past 24 hour(s))  Urinalysis complete, with microscopic- may I&O cath if menses     Status: Abnormal   Collection Time: 03/31/16 12:44 PM  Result Value Ref Range   Color, Urine YELLOW (A) YELLOW   APPearance HAZY (A) CLEAR   Glucose, UA NEGATIVE NEGATIVE mg/dL   Bilirubin Urine NEGATIVE NEGATIVE    Ketones, ur 1+ (A) NEGATIVE mg/dL   Specific Gravity, Urine 1.016 1.005 - 1.030   Hgb urine dipstick 1+ (A) NEGATIVE   pH 7.0 5.0 - 8.0   Protein, ur 100 (A) NEGATIVE mg/dL   Nitrite POSITIVE (A) NEGATIVE   Leukocytes, UA 2+ (A) NEGATIVE   RBC / HPF 6-30 0 - 5 RBC/hpf   WBC, UA TOO NUMEROUS TO COUNT 0 - 5 WBC/hpf   Bacteria, UA RARE (A) NONE SEEN   Squamous Epithelial / LPF 0-5 (A) NONE SEEN   WBC Clumps PRESENT    Mucous PRESENT   Basic metabolic panel     Status: Abnormal   Collection Time: 03/31/16 12:45 PM  Result Value Ref Range   Sodium 136 135 - 145 mmol/L   Potassium 4.0 3.5 - 5.1 mmol/L   Chloride 105 101 - 111 mmol/L   CO2 22 22 - 32 mmol/L   Glucose, Bld 175 (H) 65 - 99 mg/dL   BUN 15 6 - 20 mg/dL   Creatinine, Ser 1.06 (H) 0.44 - 1.00 mg/dL   Calcium 9.1 8.9 - 10.3 mg/dL   GFR calc non  Af Amer 52 (L) >60 mL/min   GFR calc Af Amer >60 >60 mL/min   Anion gap 9 5 - 15  CBC     Status: Abnormal   Collection Time: 03/31/16 12:45 PM  Result Value Ref Range   WBC 11.5 (H) 3.6 - 11.0 K/uL   RBC 4.50 3.80 - 5.20 MIL/uL   Hemoglobin 13.3 12.0 - 16.0 g/dL   HCT 38.5 35.0 - 47.0 %   MCV 85.5 80.0 - 100.0 fL   MCH 29.6 26.0 - 34.0 pg   MCHC 34.7 32.0 - 36.0 g/dL   RDW 13.2 11.5 - 14.5 %   Platelets 260 150 - 440 K/uL   ____________________________________________  ____________________________________________  RADIOLOGY  See chart ____________________________________________   PROCEDURES  Procedure(s) performed: none    Critical Care performed:  ____________________________________________   INITIAL IMPRESSION / ASSESSMENT AND PLAN / ED COURSE  Pertinent labs & imaging results that were available during my care of the patient were reviewed by me and considered in my medical decision making (see chart for details).  DDX: stone, pyelo, uti, abscess, sepsis, odiverticulitis  JULITA JOSS is a 70 y.o. who presents to the ED with Complaint of acute left  flank pain and generalized malaise. Patient with low-grade fever but otherwise his vital signs are stable. Abdominal exam soft and benign. Laboratory evaluation sent to evaluate for above complaints shows evidence of acute leukocytosis as well as a KI and UTI. Based on presentation glucose and area consistent with pyelonephritis. We will order CT imaging to evaluate for perinephric abscess or obstructing stone.  The patient will be placed on continuous pulse oximetry and telemetry for monitoring.  Laboratory evaluation will be sent to evaluate for the above complaints.     Clinical Course  Value Comment By Time   CT with evidence of mod hydro and perinephric stranding 2/2 76mm upj stone.  Patient receiving IV abx.   Merlyn Lot, MD 09/25 1357  NEUT#: (!) 10.9 (Reviewed) Merlyn Lot, MD 09/25 1358  Creatinine: (!) 1.06 (Reviewed) Merlyn Lot, MD 09/25 1358  WBC: (!) 11.5 (Reviewed) Merlyn Lot, MD 09/25 1358   ----------------------------------------- 2:37 PM on 03/31/2016 -----------------------------------------  Spoke with urology, Dr. Georganna Skeans, who agrees to come evaluate the patient. Patient receiving IV fluids for resuscitation and IV antibiotics. Spoke with hospitalist who agrees to admit the patient for further evaluation and management given her acute kidney injury, Sirs criteria pyelonephritis with stone.  Have discussed with the patient and available family all diagnostics and treatments performed thus far and all questions were answered to the best of my ability. The patient demonstrates understanding and agreement with plan.   ____________________________________________   FINAL CLINICAL IMPRESSION(S) / ED DIAGNOSES  Final diagnoses:  Pyelonephritis  Sepsis, due to unspecified organism ([place])  Ureterolithiasis  Hydronephrosis with urinary obstruction due to ureteral calculus  Acute kidney injury ([place])      NEW MEDICATIONS STARTED DURING THIS  VISIT:  New Prescriptions   No medications on file     Note:  This document was prepared using Dragon voice recognition software and may include unintentional dictation errors.    Merlyn Lot, MD 03/31/16 [phone]

## 2016-03-31 NOTE — Anesthesia Preprocedure Evaluation (Signed)
Anesthesia Evaluation  Patient identified by MRN, date of birth, ID band Patient awake    Reviewed: Allergy & Precautions, NPO status , Patient's Chart, lab work & pertinent test results  Airway Mallampati: II  TM Distance: >3 FB     Dental  (+) Chipped   Pulmonary shortness of breath and with exertion, former smoker,    Pulmonary exam normal        Cardiovascular hypertension, Pt. on medications Normal cardiovascular exam+ dysrhythmias   LBBB   Neuro/Psych PSYCHIATRIC DISORDERS Depression    GI/Hepatic negative GI ROS, Neg liver ROS,   Endo/Other  negative endocrine ROS  Renal/GU Renal diseaseAcute pyelonephritis  negative genitourinary   Musculoskeletal negative musculoskeletal ROS (+)   Abdominal Normal abdominal exam  (+)   Peds negative pediatric ROS (+)  Hematology negative hematology ROS (+)   Anesthesia Other Findings   Reproductive/Obstetrics                             Anesthesia Physical Anesthesia Plan  ASA: III and emergent  Anesthesia Plan: General   Post-op Pain Management:    Induction: Intravenous, Rapid sequence and Cricoid pressure planned  Airway Management Planned: Oral ETT  Additional Equipment:   Intra-op Plan:   Post-operative Plan: Extubation in OR  Informed Consent: I have reviewed the patients History and Physical, chart, labs and discussed the procedure including the risks, benefits and alternatives for the proposed anesthesia with the patient or authorized representative who has indicated his/her understanding and acceptance.   Dental advisory given  Plan Discussed with: CRNA and Surgeon  Anesthesia Plan Comments:         Anesthesia Quick Evaluation

## 2016-03-31 NOTE — Anesthesia Procedure Notes (Signed)
Procedure Name: Intubation Date/Time: 03/31/2016 4:45 PM Performed by: Darlyne Russian Pre-anesthesia Checklist: Patient identified, Emergency Drugs available, Suction available and Patient being monitored Patient Re-evaluated:Patient Re-evaluated prior to inductionOxygen Delivery Method: Circle system utilized Preoxygenation: Pre-oxygenation with 100% oxygen Intubation Type: IV induction, Rapid sequence and Cricoid Pressure applied Ventilation: Mask ventilation without difficulty Laryngoscope Size: Mac and 3 Grade View: Grade I Tube type: Oral Tube size: 7.0 mm Number of attempts: 1 Airway Equipment and Method: Stylet Placement Confirmation: ETT inserted through vocal cords under direct vision,  positive ETCO2 and breath sounds checked- equal and bilateral Secured at: 21 cm Tube secured with: Tape Dental Injury: Teeth and Oropharynx as per pre-operative assessment

## 2016-03-31 NOTE — ED Notes (Signed)
Glasses and clothing sent with pt to OR in belongings bag.

## 2016-03-31 NOTE — Op Note (Signed)
.  Preoperative diagnosis: Left ureteral stone, pyelonephritis  Postoperative diagnosis: Same  Procedure: 1 cystoscopy 2. Left retrograde pyelography 3.  Intraoperative fluoroscopy, under one hour, with interpretation 4. Left 6 x 24 JJ stent placement  Attending: Nicolette Bang  Anesthesia: General  Estimated blood loss: None  Drains: Left 6 x 24 JJ ureteral stent without tether, 16 French foley catheter  Specimens: urine for culture  Antibiotics: Rocephin  Findings: left proximal ureteral stone. Moderate hydronephrosis. No masses/lesions in the bladder. Ureteral orifices in normal anatomic location.  Indications: Patient is a 70 year old female with a history of left renal stone and concern for sepsis.  After discussing treatment options, they decided proceed with left stent placement.  Procedure her in detail: The patient was brought to the operating room and a brief timeout was done to ensure correct patient, correct procedure, correct site.  General anesthesia was administered patient was placed in dorsal lithotomy position.  Their genitalia was then prepped and draped in usual sterile fashion.  A rigid 52 French cystoscope was passed in the urethra and the bladder.  Bladder was inspected free masses or lesions.  the ureteral orifices were in the normal orthotopic locations.  a 6 french ureteral catheter was then instilled into the left ureteral orifice.  a gentle retrograde was obtained and findings noted above.  we then placed a sensor wire through the ureteral catheter and advanced up to the renal pelvis.    We then placed a 6 x 24 double-j ureteral stent over the original zip wire.  We then removed the wire and good coil was noted in the the renal pelvis under fluoroscopy and the bladder under direct vision.  A foley catheter was then placed. the bladder was then drained and this concluded the procedure which was well tolerated by patient.  Complications: None  Condition: Stable,  extubated, transferred to PACU  Plan: Patient is to be admitted for IV antibiotics and cardiovascular monitoring. She will have her stone extraction in 2 weeks after 2 weeks of culture specific antibiotics.

## 2016-03-31 NOTE — H&P (Addendum)
[facility] NAME: Julia Sandoval    MR#:  [id number]  DATE OF BIRTH:  [birth year]  DATE OF ADMISSION:  03/31/2016  PRIMARY CARE PHYSICIAN: Marcello Fennel, MD   REQUESTING/REFERRING PHYSICIAN: Dr. Merlyn Lot  CHIEF COMPLAINT:   Chief Complaint  Patient presents with  . Flank Pain  . Back Pain    HISTORY OF PRESENT ILLNESS:  Julia Sandoval  is a 70 y.o. female with a known history of Depression, hypertension, hyperlipidemia, who presents to the hospital due to left flank pain that began yesterday. She describes the pain as being sharp in nature sometimes radiating down her left leg. She denies any hematuria, cloudy urine or difficulty urinating. She does say she had some episodes of nausea but no vomiting. He also complains of chills but no documented fever. Since the pain was not improving she came to the ER for further evaluation. Patient underwent a CT scan of her abdomen pelvis which showed a left-sided nephro lithiasis with associated acute pyelonephritis. Hospitalist services were For treatment and evaluation.  PAST MEDICAL HISTORY:   Past Medical History:  Diagnosis Date  . Depression   . Hyperlipidemia    Mixed  . Hypertension    Unspecified  . LBBB (left bundle branch block)   . Obesity     PAST SURGICAL HISTORY:   Past Surgical History:  Procedure Laterality Date  . ANKLE FRACTURE SURGERY    . BREAST BIOPSY Right    neg bx  . BREAST SURGERY Right 07/04/2013   Stereotactic biopsy for asymmetric density and calcifications: Flat epithelial atypia, intraductal papilloma, fragmented.  Marland Kitchen BREAST SURGERY Right 09/06/13   Right breast wide excision, ADH only  . CESAREAN SECTION    . CHOLECYSTECTOMY    . COLONOSCOPY  2013  . DILATION AND CURETTAGE OF UTERUS  2015  . GASTRIC BYPASS  2012  . SMALL INTESTINE SURGERY  07/2015   Accenditial cut-[facility]  . VENTRAL HERNIA REPAIR N/A 01/25/2016   Procedure: HERNIA REPAIR VENTRAL  ADULT;  Surgeon: Leonie Green, MD;  Location: [facility] ORS;  Service: General;  Laterality: N/A;    SOCIAL HISTORY:   Social History  Substance Use Topics  . Smoking status: Former Smoker    Packs/day: 0.50    Years: 10.00    Types: Cigarettes    Quit date: 07/07/1998  . Smokeless tobacco: Never Used  . Alcohol use No    FAMILY HISTORY:   Family History  Problem Relation Age of Onset  . Stroke Other   . Heart disease Mother   . Alzheimer's disease Father   . Breast cancer Neg Hx     DRUG ALLERGIES:  No Known Allergies  REVIEW OF SYSTEMS:   Review of Systems  Constitutional: Negative for fever and weight loss.  HENT: Negative for congestion, nosebleeds and tinnitus.   Eyes: Negative for blurred vision, double vision and redness.  Respiratory: Negative for cough, hemoptysis and shortness of breath.   Cardiovascular: Negative for chest pain, orthopnea, leg swelling and PND.  Gastrointestinal: Positive for abdominal pain (Left flank) and nausea. Negative for diarrhea, melena and vomiting.  Genitourinary: Negative for dysuria, hematuria and urgency.  Musculoskeletal: Negative for falls and joint pain.  Neurological: Negative for dizziness, tingling, sensory change, focal weakness, seizures, weakness and headaches.  Endo/Heme/Allergies: Negative for polydipsia. Does not bruise/bleed easily.  Psychiatric/Behavioral: Negative for depression and memory loss. The patient is not nervous/anxious.     MEDICATIONS  AT HOME:   Prior to Admission medications   Medication Sig Start Date End Date Taking? Authorizing Provider  buPROPion (WELLBUTRIN XL) 300 MG 24 hr tablet Take 300 mg by mouth every morning. 01/05/16  Yes Historical Provider, MD  Cholecalciferol (VITAMIN D3) 5000 units CAPS Take 1 capsule by mouth every morning.    Yes Historical Provider, MD  Ginkgo Biloba (GINKGO PO) Take 1 tablet by mouth every morning.    Yes Historical Provider, MD  glucosamine-chondroitin 500-400  MG tablet Take 1 tablet by mouth 2 (two) times daily.   Yes Historical Provider, MD  HYDROcodone-acetaminophen (NORCO) 5-325 MG tablet Take 1-2 tablets by mouth every 4 (four) hours as needed for moderate pain. 01/25/16  Yes Leonie Green, MD  ibuprofen (ADVIL,MOTRIN) 600 MG tablet Take 600 mg by mouth 2 (two) times daily. Pain   12/21/15  Yes Historical Provider, MD  Multiple Vitamin (MULTIVITAMIN) tablet Take 1 tablet by mouth every morning.    Yes Historical Provider, MD  Omega-3 Fatty Acids (FISH OIL PO) Take 1 capsule by mouth 2 (two) times daily.   Yes Historical Provider, MD  QUEtiapine (SEROQUEL) 100 MG tablet Take 100 mg by mouth at bedtime.   Yes Historical Provider, MD  raloxifene (EVISTA) 60 MG tablet Take 1 tablet (60 mg total) by mouth daily. Patient taking differently: Take 60 mg by mouth daily. *Patient takes in the afternoon* 11/06/15  Yes Robert Bellow, MD      VITAL SIGNS:  Blood pressure (!) 161/98, pulse 96, temperature 99.1 F (37.3 C), temperature source Oral, resp. rate 20, height 5\' 4"  (1.626 m), weight 74.8 kg (165 lb), SpO2 97 %.  PHYSICAL EXAMINATION:  Physical Exam  GENERAL:  70 y.o.-year-old patient lying in the bed with no acute distress.  EYES: Pupils equal, round, reactive to light and accommodation. No scleral icterus. Extraocular muscles intact.  HEENT: Head atraumatic, normocephalic. Oropharynx and nasopharynx clear. No oropharyngeal erythema, moist oral mucosa  NECK:  Supple, no jugular venous distention. No thyroid enlargement, no tenderness.  LUNGS: Normal breath sounds bilaterally, no wheezing, rales, rhonchi. No use of accessory muscles of respiration.  CARDIOVASCULAR: S1, S2 RRR. No murmurs, rubs, gallops, clicks.  ABDOMEN: Soft, Left flank pain, no rebound, rigidity. nondistended. Bowel sounds present. No organomegaly or mass.  EXTREMITIES: No pedal edema, cyanosis, or clubbing. + 2 pedal & radial pulses b/l.   NEUROLOGIC: Cranial nerves II  through XII are intact. No focal Motor or sensory deficits appreciated b/l PSYCHIATRIC: The patient is alert and oriented x 3. Good affect.  SKIN: No obvious rash, lesion, or ulcer.   LABORATORY PANEL:   CBC  Recent Labs Lab 03/31/16 1245  WBC 11.5*  HGB 13.3  HCT 38.5  PLT 260   ------------------------------------------------------------------------------------------------------------------  Chemistries   Recent Labs Lab 03/31/16 1245  NA 136  K 4.0  CL 105  CO2 22  GLUCOSE 175*  BUN 15  CREATININE 1.06*  CALCIUM 9.1  AST 29  ALT 20  ALKPHOS 66  BILITOT 1.2   ------------------------------------------------------------------------------------------------------------------  Cardiac Enzymes No results for input(s): TROPONINI in the last 168 hours. ------------------------------------------------------------------------------------------------------------------  RADIOLOGY:  Ct Renal Stone Study  Result Date: 03/31/2016 CLINICAL DATA:  Acute left flank pain. EXAM: CT ABDOMEN AND PELVIS WITHOUT CONTRAST TECHNIQUE: Multidetector CT imaging of the abdomen and pelvis was performed following the standard protocol without IV contrast. COMPARISON:  CT scan of December 26, 2015. FINDINGS: Lower chest: Visualized lung bases are unremarkable. Hepatobiliary: Status post  cholecystectomy. No focal abnormality seen in the liver on unenhanced images. Pancreas: Normal. Spleen: Normal. Adrenals/Urinary Tract: Adrenal glands appear normal. Right kidney and ureter appear normal. Moderate left hydronephrosis is noted secondary to 9 mm calculus at the left ureteropelvic junction. Significant perinephric stranding is noted. Urinary bladder is unremarkable. Stomach/Bowel: Status post gastric bypass. Small bowel anastomotic sutures are noted in left lower quadrant. No abnormal bowel dilatation is noted. The appendix appears normal. Vascular/Lymphatic: Atherosclerosis of abdominal aorta is noted  without aneurysm formation. 11 mm left periaortic lymph node is noted which is significantly enlarged compared to prior exam most likely is inflammatory in origin given the adjacent inflammatory changes noted around the left kidney. Reproductive: Uterus and ovaries are unremarkable. Other: No abnormal fluid collection is noted. Musculoskeletal: Multilevel degenerative disc disease is noted in the lower thoracic and lumbar spine. IMPRESSION: Aortic atherosclerosis. Moderate left hydronephrosis with perinephric stranding secondary to 9 mm calculus at left ureteropelvic junction. Electronically Signed   By: Marijo Conception, M.D.   On: 03/31/2016 13:52     IMPRESSION AND PLAN:   70 year old female with past medical history of hypertension, depression, sent to the hospital due to left flank pain and nausea noted to have nephrolithiasis with associated pyelonephritis.  1. Acute pyelonephritis-this is secondary to the nephrolithiasis and also underlying UTI. -Supportive care with IV fluids, antiemetics, pain control. Place the patient on IV ceftriaxone, follow urine cultures.  2. Left-sided nephrolithiasis-patient has a 9 mm left ureteropelvic junctional calculus causing moderate left-sided hydronephrosis. [facility] consult urology, patient likely kidney left-sided ureteral stent. -Continue pain control and supportive care.  3. Depression-continue Wellbutrin, Seroquel.  4. Leukocytosis-likely stress mediated and also due to underlying polynephritis. -We'll follow white cell count with IV abx. therapy.  All the records are reviewed and case discussed with ED provider. Management plans discussed with the patient, family and they are in agreement.  CODE STATUS: Full Code  TOTAL TIME TAKING CARE OF THIS PATIENT: 45 minutes.    Henreitta Leber M.D on 03/31/2016 at 4:00 PM  Between 7am to 6pm - Pager - [phone]  After 6pm go to [email or web address] - password [email or web address] [place] [facility]   Office  [phone]  CC: Primary care physician; BABAOFF, Caryl Bis, MD

## 2016-03-31 NOTE — Consult Note (Signed)
Urology Consult  Referring physician: Dr. Verdell Carmine Reason for referral: left ureteral calculus  Chief Complaint: left flank pain  History of Present Illness: Julia Sandoval is a 70yo with a hx of HTN who presented to [facility] with a 1 day hx of severe constant, sharp, nonradiating left flank pain. She has associated nausea and vomiting. She denies any LUTS, no hematuria. She has been having chills and feels febrile for the past day. WBC count is 11.5. CT scan shows a 58m L UPJ calculus with severe perinephric stranding. UA was normal  Past Medical History:  Diagnosis Date  . Depression   . Hyperlipidemia    Mixed  . Hypertension    Unspecified  . LBBB (left bundle branch block)   . Obesity    Past Surgical History:  Procedure Laterality Date  . ANKLE FRACTURE SURGERY    . BREAST BIOPSY Right    neg bx  . BREAST SURGERY Right 07/04/2013   Stereotactic biopsy for asymmetric density and calcifications: Flat epithelial atypia, intraductal papilloma, fragmented.  .Marland KitchenBREAST SURGERY Right 09/06/13   Right breast wide excision, ADH only  . CESAREAN SECTION    . CHOLECYSTECTOMY    . COLONOSCOPY  2013  . DILATION AND CURETTAGE OF UTERUS  2015  . GASTRIC BYPASS  2012  . SMALL INTESTINE SURGERY  07/2015   Accenditial cut-[facility]  . VENTRAL HERNIA REPAIR N/A 01/25/2016   Procedure: HERNIA REPAIR VENTRAL ADULT;  Surgeon: JLeonie Green MD;  Location: [facility] ORS;  Service: General;  Laterality: N/A;    Medications: I have reviewed the patient's current medications. Allergies: No Known Allergies  Family History  Problem Relation Age of Onset  . Stroke Other   . Breast cancer Neg Hx    Social History:  reports that she quit smoking about 17 years ago. Her smoking use included Cigarettes. She has a 5.00 pack-year smoking history. She has never used smokeless tobacco. She reports that she does not drink alcohol or use drugs.  Review of Systems  Constitutional: Positive for chills, fever and  malaise/fatigue.  Gastrointestinal: Positive for nausea and vomiting.  Genitourinary: Positive for flank pain.  All other systems reviewed and are negative.   Physical Exam:  Vital signs in last 24 hours: Temp:  [99.1 F (37.3 C)] 99.1 F (37.3 C) (09/25 1241) Pulse Rate:  [96] 96 (09/25 1241) Resp:  [20] 20 (09/25 1241) BP: (161)/(98) 161/98 (09/25 1241) SpO2:  [97 %] 97 % (09/25 1241) Weight:  [74.8 kg (165 lb)] 74.8 kg (165 lb) (09/25 1241) Physical Exam  Constitutional: She is oriented to person, place, and time. She appears well-developed and well-nourished.  HENT:  Head: Normocephalic and atraumatic.  Eyes: EOM are normal. Pupils are equal, round, and reactive to light.  Neck: Normal range of motion. No thyromegaly present.  Cardiovascular: Normal rate and regular rhythm.   Respiratory: Effort normal. No respiratory distress.  GI: Soft. She exhibits no distension.  Musculoskeletal: Normal range of motion. She exhibits no edema.  Neurological: She is alert and oriented to person, place, and time.  Skin: Skin is warm and dry.  Psychiatric: She has a normal mood and affect. Her behavior is normal. Judgment and thought content normal.    Laboratory Data:  Results for orders placed or performed during the hospital encounter of 03/31/16 (from the past 72 hour(s))  Urinalysis complete, with microscopic- may I&O cath if menses     Status: Abnormal   Collection Time: 03/31/16 12:44 PM  Result Value Ref Range   Color, Urine YELLOW (A) YELLOW   APPearance HAZY (A) CLEAR   Glucose, UA NEGATIVE NEGATIVE mg/dL   Bilirubin Urine NEGATIVE NEGATIVE   Ketones, ur 1+ (A) NEGATIVE mg/dL   Specific Gravity, Urine 1.016 1.005 - 1.030   Hgb urine dipstick 1+ (A) NEGATIVE   pH 7.0 5.0 - 8.0   Protein, ur 100 (A) NEGATIVE mg/dL   Nitrite POSITIVE (A) NEGATIVE   Leukocytes, UA 2+ (A) NEGATIVE   RBC / HPF 6-30 0 - 5 RBC/hpf   WBC, UA TOO NUMEROUS TO COUNT 0 - 5 WBC/hpf   Bacteria, UA RARE  (A) NONE SEEN   Squamous Epithelial / LPF 0-5 (A) NONE SEEN   WBC Clumps PRESENT    Mucous PRESENT   Basic metabolic panel     Status: Abnormal   Collection Time: 03/31/16 12:45 PM  Result Value Ref Range   Sodium 136 135 - 145 mmol/L   Potassium 4.0 3.5 - 5.1 mmol/L   Chloride 105 101 - 111 mmol/L   CO2 22 22 - 32 mmol/L   Glucose, Bld 175 (H) 65 - 99 mg/dL   BUN 15 6 - 20 mg/dL   Creatinine, Ser 1.06 (H) 0.44 - 1.00 mg/dL   Calcium 9.1 8.9 - 10.3 mg/dL   GFR calc non Af Amer 52 (L) >60 mL/min   GFR calc Af Amer >60 >60 mL/min    Comment: (NOTE) The eGFR has been calculated using the CKD EPI equation. This calculation has not been validated in all clinical situations. eGFR's persistently <60 mL/min signify possible Chronic Kidney Disease.    Anion gap 9 5 - 15  CBC     Status: Abnormal   Collection Time: 03/31/16 12:45 PM  Result Value Ref Range   WBC 11.5 (H) 3.6 - 11.0 K/uL   RBC 4.50 3.80 - 5.20 MIL/uL   Hemoglobin 13.3 12.0 - 16.0 g/dL   HCT 38.5 35.0 - 47.0 %   MCV 85.5 80.0 - 100.0 fL   MCH 29.6 26.0 - 34.0 pg   MCHC 34.7 32.0 - 36.0 g/dL   RDW 13.2 11.5 - 14.5 %   Platelets 260 150 - 440 K/uL  Differential     Status: Abnormal   Collection Time: 03/31/16 12:45 PM  Result Value Ref Range   Neutrophils Relative % 93 %   Neutro Abs 10.9 (H) 1.4 - 6.5 K/uL   Lymphocytes Relative 3 %   Lymphs Abs 0.3 (L) 1.0 - 3.6 K/uL   Monocytes Relative 4 %   Monocytes Absolute 0.5 0.2 - 0.9 K/uL   Eosinophils Relative 0 %   Eosinophils Absolute 0.0 0 - 0.7 K/uL   Basophils Relative 0 %   Basophils Absolute 0.0 0 - 0.1 K/uL  Hepatic function panel     Status: Abnormal   Collection Time: 03/31/16 12:45 PM  Result Value Ref Range   Total Protein 7.2 6.5 - 8.1 g/dL   Albumin 3.3 (L) 3.5 - 5.0 g/dL   AST 29 15 - 41 U/L   ALT 20 14 - 54 U/L   Alkaline Phosphatase 66 38 - 126 U/L   Total Bilirubin 1.2 0.3 - 1.2 mg/dL   Bilirubin, Direct 0.2 0.1 - 0.5 mg/dL   Indirect  Bilirubin 1.0 (H) 0.3 - 0.9 mg/dL   No results found for this or any previous visit (from the past 240 hour(s)). Creatinine:  Recent Labs  03/31/16 1245  CREATININE 1.06*  Baseline Creatinine: 1  Impression/Assessment:  69yo with L UPJ calculus, leukocytosis, concern for pyelonephritis  Plan:  1. The risks/ benefits/alternatives to left ureteral stent placement was explained to the patient and she understands and wishes to proceed with surgery The patient will be admitted for IV antibiotics and CV monitoring post op.  Nicolette Bang 03/31/2016, 3:50 PM

## 2016-03-31 NOTE — Transfer of Care (Signed)
Immediate Anesthesia Transfer of Care Note  Patient: Julia Sandoval  Procedure(s) Performed: Procedure(s): CYSTOSCOPY WITH RETROGRADE PYELOGRAM (N/A) CYSTOSCOPY WITH STENT PLACEMENT (Left)  Patient Location: PACU  Anesthesia Type:General  Level of Consciousness: awake, alert , oriented and sedated  Airway & Oxygen Therapy: Patient Spontanous Breathing and Patient connected to face mask oxygen  Post-op Assessment: Report given to RN and Post -op Vital signs reviewed and stable  Post vital signs: Reviewed and stable  Last Vitals:  Vitals:   03/31/16 1241 03/31/16 1732  BP: (!) 161/98 (!) 123/58  Pulse: 96 100  Resp: 20 20  Temp: 37.3 C (!) 40.6 C    Last Pain:  Vitals:   03/31/16 1341  TempSrc:   PainSc: 8          Complications: No apparent anesthesia complications

## 2016-03-31 NOTE — ED Notes (Signed)
Report given to OR.

## 2016-04-01 ENCOUNTER — Encounter: Payer: Self-pay | Admitting: Urology

## 2016-04-01 LAB — BASIC METABOLIC PANEL
ANION GAP: 7 (ref 5–15)
BUN: 16 mg/dL (ref 6–20)
CHLORIDE: 114 mmol/L — AB (ref 101–111)
CO2: 22 mmol/L (ref 22–32)
Calcium: 8.3 mg/dL — ABNORMAL LOW (ref 8.9–10.3)
Creatinine, Ser: 0.86 mg/dL (ref 0.44–1.00)
Glucose, Bld: 220 mg/dL — ABNORMAL HIGH (ref 65–99)
POTASSIUM: 3.3 mmol/L — AB (ref 3.5–5.1)
SODIUM: 143 mmol/L (ref 135–145)

## 2016-04-01 LAB — CBC
HEMATOCRIT: 34.2 % — AB (ref 35.0–47.0)
Hemoglobin: 11.4 g/dL — ABNORMAL LOW (ref 12.0–16.0)
MCH: 29.1 pg (ref 26.0–34.0)
MCHC: 33.4 g/dL (ref 32.0–36.0)
MCV: 87 fL (ref 80.0–100.0)
Platelets: 214 10*3/uL (ref 150–440)
RBC: 3.93 MIL/uL (ref 3.80–5.20)
RDW: 13.4 % (ref 11.5–14.5)
WBC: 15.1 10*3/uL — AB (ref 3.6–11.0)

## 2016-04-01 LAB — MAGNESIUM: Magnesium: 1.7 mg/dL (ref 1.7–2.4)

## 2016-04-01 MED ORDER — POTASSIUM CHLORIDE CRYS ER 20 MEQ PO TBCR
40.0000 meq | EXTENDED_RELEASE_TABLET | Freq: Once | ORAL | Status: AC
  Administered 2016-04-01: 40 meq via ORAL
  Filled 2016-04-01: qty 2

## 2016-04-01 MED ORDER — OXYCODONE-ACETAMINOPHEN 5-325 MG PO TABS
1.0000 | ORAL_TABLET | Freq: Four times a day (QID) | ORAL | Status: DC | PRN
  Administered 2016-04-01 – 2016-04-03 (×5): 1 via ORAL
  Filled 2016-04-01 (×5): qty 1

## 2016-04-01 NOTE — Progress Notes (Signed)
[place] [facility] [place] NAME: Teresha Lacorte    MR#:  [id number]  DATE OF BIRTH:  1945-12-12  SUBJECTIVE:  CHIEF COMPLAINT:   Chief Complaint  Patient presents with  . Flank Pain  . Back Pain   Fever and chills. REVIEW OF SYSTEMS:  Review of Systems  Constitutional: Positive for chills, fever and malaise/fatigue.  HENT: Negative for sore throat.   Eyes: Negative for blurred vision and double vision.  Respiratory: Negative for cough, shortness of breath and wheezing.   Cardiovascular: Negative for chest pain, palpitations and leg swelling.  Gastrointestinal: Positive for nausea. Negative for abdominal pain, blood in stool, melena and vomiting.  Genitourinary: Negative for dysuria and flank pain.  Musculoskeletal: Negative for joint pain.  Skin: Negative for itching and rash.  Neurological: Negative for dizziness, seizures, loss of consciousness and headaches.  Psychiatric/Behavioral: Negative for depression. The patient is not nervous/anxious.     DRUG ALLERGIES:  No Known Allergies VITALS:  Blood pressure (!) 123/54, pulse (!) 103, temperature 98.4 F (36.9 C), temperature source Oral, resp. rate 18, height 5\' 4"  (1.626 m), weight 165 lb (74.8 kg), SpO2 98 %. PHYSICAL EXAMINATION:  Physical Exam  Constitutional: She is oriented to person, place, and time and well-developed, well-nourished, and in no distress. No distress.  HENT:  Head: Normocephalic.  Mouth/Throat: Oropharynx is clear and moist.  Eyes: Conjunctivae and EOM are normal. No scleral icterus.  Neck: No JVD present.  Cardiovascular: Normal rate, regular rhythm and normal heart sounds.  Exam reveals no gallop.   No murmur heard. Pulmonary/Chest: Effort normal and breath sounds normal. No respiratory distress. She has no wheezes.  Abdominal: Soft. Bowel sounds are normal. She exhibits no distension. There is no tenderness.  Musculoskeletal: Normal range of motion. She  exhibits no edema or tenderness.  Lymphadenopathy:    She has no cervical adenopathy.  Neurological: She is alert and oriented to person, place, and time. No cranial nerve deficit. Gait normal.  Skin: Skin is warm and dry.  Psychiatric: Affect and judgment normal.   LABORATORY PANEL:   CBC  Recent Labs Lab 04/01/16 0509  WBC 15.1*  HGB 11.4*  HCT 34.2*  PLT 214   ------------------------------------------------------------------------------------------------------------------ Chemistries   Recent Labs Lab 03/31/16 1245 04/01/16 0509  NA 136 143  K 4.0 3.3*  CL 105 114*  CO2 22 22  GLUCOSE 175* 220*  BUN 15 16  CREATININE 1.06* 0.86  CALCIUM 9.1 8.3*  MG  --  1.7  AST 29  --   ALT 20  --   ALKPHOS 66  --   BILITOT 1.2  --    RADIOLOGY:  Dg Abd 1 View  Result Date: 03/31/2016 CLINICAL DATA:  Left kidney stent. EXAM: DG C-ARM 61-120 MIN; ABDOMEN - 1 VIEW COMPARISON:  None FINDINGS: Single fluoroscopic image shows contrast within the collecting system, presumably related to retrograde pyelogram. Stent is not clearly seen on this single image. IMPRESSION: Intraoperative fluoroscopic image, as detailed above. Electronically Signed   By: Franki Cabot M.D.   On: 03/31/2016 17:40   Dg C-arm 1-60 Min  Result Date: 03/31/2016 CLINICAL DATA:  Left kidney stent. EXAM: DG C-ARM 61-120 MIN; ABDOMEN - 1 VIEW COMPARISON:  None FINDINGS: Single fluoroscopic image shows contrast within the collecting system, presumably related to retrograde pyelogram. Stent is not clearly seen on this single image. IMPRESSION: Intraoperative fluoroscopic image, as detailed above. Electronically Signed   By: Cherlynn Kaiser  Enriqueta Shutter M.D.   On: 03/31/2016 17:40   ASSESSMENT AND PLAN:   70 year old female with past medical history of hypertension, depression, sent to the hospital due to left flank pain and nausea noted to have nephrolithiasis with associated pyelonephritis.  1. Acute pyelonephritis-this is  secondary to the nephrolithiasis and also underlying UTI. -Supportive care with IV fluids, antiemetics, pain control. Continue IV ceftriaxone, follow urine cultures.  2. Left-sided nephrolithiasis-patient has a 9 mm left ureteropelvic junctional calculus causing moderate left-sided hydronephrosis. S/p left-sided ureteral stent. -Continue pain control and supportive care. F/u urology in 2 weeks as oupt.  3. Depression-continue Wellbutrin, Seroquel.  4. Leukocytosis-likely stress mediated and also due to underlying polynephritis. F/u CBC.  Hypokalemia. Given KCl.  All the records are reviewed and case discussed with Care Management/Social Worker. Management plans discussed with the patient, family and they are in agreement.  CODE STATUS: full code.  TOTAL TIME TAKING CARE OF THIS PATIENT: .39 minutes.   More than 50% of the time was spent in counseling/coordination of care: YES  POSSIBLE D/C IN 2 DAYS, DEPENDING ON CLINICAL CONDITION.   Demetrios Loll M.D on 04/01/2016 at 2:18 PM  Between 7am to 6pm - Pager - [phone]  After 6pm go to [email or web address] - Proofreader  [employer] [facility] [place] [facility]  Office  [phone]  CC: Primary care physician; BABAOFF, Caryl Bis, MD  Note: This dictation was prepared with Dragon dictation along with smaller phrase technology. Any transcriptional errors that result from this process are unintentional.

## 2016-04-01 NOTE — Progress Notes (Signed)
Urology Consult Follow Up  Subjective: Febrile to 105 postop following stent placement. Normotensive. Non-tachycardic. Feels clinically improved. Has no complaints today other than sensation of urinary urgency despite catheter in place.  Anti-infectives: Anti-infectives    Start     Dose/Rate Route Frequency Ordered Stop   04/01/16 1800  cefTRIAXone (ROCEPHIN) 1 g in dextrose 5 % 50 mL IVPB     1 g 100 mL/hr over 30 Minutes Intravenous Every 24 hours 03/31/16 2025     03/31/16 1745  cefTRIAXone (ROCEPHIN) 1 g in dextrose 5 % 50 mL IVPB     1 g 100 mL/hr over 30 Minutes Intravenous  Once 03/31/16 1741 03/31/16 1848   03/31/16 1743  ceFAZolin (ANCEF) 1 GM/50ML IVPB    Comments:  Loren Racer: cabinet override      03/31/16 1743 03/31/16 1753   03/31/16 1330  cefTRIAXone (ROCEPHIN) 1 g in dextrose 5 % 50 mL IVPB     1 g 100 mL/hr over 30 Minutes Intravenous  Once 03/31/16 1313 03/31/16 1449      Current Facility-Administered Medications  Medication Dose Route Frequency Provider Last Rate Last Dose  . 0.9 %  sodium chloride infusion   Intravenous Continuous Henreitta Leber, MD 125 mL/hr at 04/01/16 0359    . acetaminophen (TYLENOL) tablet 650 mg  650 mg Oral Q6H PRN Henreitta Leber, MD       Or  . acetaminophen (TYLENOL) suppository 650 mg  650 mg Rectal Q6H PRN Henreitta Leber, MD      . buPROPion (WELLBUTRIN XL) 24 hr tablet 300 mg  300 mg Oral BH-q7a Henreitta Leber, MD   300 mg at 04/01/16 0546  . cefTRIAXone (ROCEPHIN) 1 g in dextrose 5 % 50 mL IVPB  1 g Intravenous Q24H Lenis Noon, [facility]      . cholecalciferol (VITAMIN D) tablet 5,000 Units  5,000 Units Oral [id number] Henreitta Leber, MD      . enoxaparin (LOVENOX) injection 40 mg  40 mg Subcutaneous Q24H Henreitta Leber, MD   40 mg at 03/31/16 2233  . morphine 2 MG/ML injection 1 mg  1 mg Intravenous Q3H PRN Henreitta Leber, MD      . multivitamin with minerals tablet 1 tablet  1 tablet Oral BH-q7a Henreitta Leber, MD      .  ondansetron (ZOFRAN) tablet 4 mg  4 mg Oral Q6H PRN Henreitta Leber, MD       Or  . ondansetron (ZOFRAN) injection 4 mg  4 mg Intravenous Q6H PRN Henreitta Leber, MD      . QUEtiapine (SEROQUEL) tablet 100 mg  100 mg Oral QHS Henreitta Leber, MD   100 mg at 03/31/16 2233  . raloxifene (EVISTA) tablet 60 mg  60 mg Oral Daily Henreitta Leber, MD   60 mg at 04/01/16 0914     Objective: Vital signs in last 24 hours: Temp:  [97.4 F (36.3 C)-105 F (40.6 C)] 97.4 F (36.3 C) (09/26 0617) Pulse Rate:  [58-100] 58 (09/26 0617) Resp:  [12-30] 17 (09/26 0617) BP: (100-161)/(44-98) 100/44 (09/26 0617) SpO2:  [93 %-100 %] 99 % (09/26 0617) Weight:  [165 lb (74.8 kg)] 165 lb (74.8 kg) (09/25 1241)  Intake/Output from previous day: 09/25 0701 - 09/26 0700 In: 1407 [I.V.:1407] Out: 1350 [Urine:1350] Intake/Output this shift: Total I/O In: 526.2 [P.O.:240; I.V.:286.2] Out: 300 [Urine:300]   Physical Exam  Constitutional: She is oriented to person,  place, and time and well-developed, well-nourished, and in no distress.  HENT:  Head: Normocephalic and atraumatic.  Neck: Normal range of motion.  Abdominal: Soft.  Genitourinary:  Genitourinary Comments: Foley catheter draining clear yellow urine, no clots.  Neurological: She is alert and oriented to person, place, and time.  Skin: Skin is warm and dry.  Psychiatric: Affect normal.    Lab Results:   Recent Labs  03/31/16 1245 04/01/16 0509  WBC 11.5* 15.1*  HGB 13.3 11.4*  HCT 38.5 34.2*  PLT 260 214   BMET  Recent Labs  03/31/16 1245 04/01/16 0509  NA 136 143  K 4.0 3.3*  CL 105 114*  CO2 22 22  GLUCOSE 175* 220*  BUN 15 16  CREATININE 1.06* 0.86  CALCIUM 9.1 8.3*   Studies/Results: Dg Abd 1 View  Result Date: 03/31/2016 CLINICAL DATA:  Left kidney stent. EXAM: DG C-ARM 61-120 MIN; ABDOMEN - 1 VIEW COMPARISON:  None FINDINGS: Single fluoroscopic image shows contrast within the collecting system, presumably related  to retrograde pyelogram. Stent is not clearly seen on this single image. IMPRESSION: Intraoperative fluoroscopic image, as detailed above. Electronically Signed   By: Franki Cabot M.D.   On: 03/31/2016 17:40   Dg C-arm 1-60 Min  Result Date: 03/31/2016 CLINICAL DATA:  Left kidney stent. EXAM: DG C-ARM 61-120 MIN; ABDOMEN - 1 VIEW COMPARISON:  None FINDINGS: Single fluoroscopic image shows contrast within the collecting system, presumably related to retrograde pyelogram. Stent is not clearly seen on this single image. IMPRESSION: Intraoperative fluoroscopic image, as detailed above. Electronically Signed   By: Franki Cabot M.D.   On: 03/31/2016 17:40   Ct Renal Stone Study  Result Date: 03/31/2016 CLINICAL DATA:  Acute left flank pain. EXAM: CT ABDOMEN AND PELVIS WITHOUT CONTRAST TECHNIQUE: Multidetector CT imaging of the abdomen and pelvis was performed following the standard protocol without IV contrast. COMPARISON:  CT scan of December 26, 2015. FINDINGS: Lower chest: Visualized lung bases are unremarkable. Hepatobiliary: Status post cholecystectomy. No focal abnormality seen in the liver on unenhanced images. Pancreas: Normal. Spleen: Normal. Adrenals/Urinary Tract: Adrenal glands appear normal. Right kidney and ureter appear normal. Moderate left hydronephrosis is noted secondary to 9 mm calculus at the left ureteropelvic junction. Significant perinephric stranding is noted. Urinary bladder is unremarkable. Stomach/Bowel: Status post gastric bypass. Small bowel anastomotic sutures are noted in left lower quadrant. No abnormal bowel dilatation is noted. The appendix appears normal. Vascular/Lymphatic: Atherosclerosis of abdominal aorta is noted without aneurysm formation. 11 mm left periaortic lymph node is noted which is significantly enlarged compared to prior exam most likely is inflammatory in origin given the adjacent inflammatory changes noted around the left kidney. Reproductive: Uterus and ovaries  are unremarkable. Other: No abnormal fluid collection is noted. Musculoskeletal: Multilevel degenerative disc disease is noted in the lower thoracic and lumbar spine. IMPRESSION: Aortic atherosclerosis. Moderate left hydronephrosis with perinephric stranding secondary to 9 mm calculus at left ureteropelvic junction. Electronically Signed   By: Marijo Conception, M.D.   On: 03/31/2016 13:52     Assessment: POD 1 s/p Procedure(s): CYSTOSCOPY WITH RETROGRADE PYELOGRAM CYSTOSCOPY WITH STENT PLACEMENT  Plan: -d/c Foley -continue to follow-up urine culture and adjust as needed, unfortunately appears no blood cultures were sent at the time of high-grade fever -continue ceftriaxone, adjust based on culture and sensitivity data -will arrange for outpt follow up to discuss definitive management of her stone -please discharged home with pain medication, Flomax, Ditropan along with abx when  clinically appropriate    LOS: 1 day    Hollice Espy 04/01/2016

## 2016-04-01 NOTE — Progress Notes (Signed)
D/C foley per order. Pt due to void by 1942 on 04/01/16

## 2016-04-01 NOTE — Anesthesia Postprocedure Evaluation (Signed)
Anesthesia Post Note  Patient: [name]  Procedure(s) Performed: Procedure(s) (LRB): CYSTOSCOPY WITH RETROGRADE PYELOGRAM (N/A) CYSTOSCOPY WITH STENT PLACEMENT (Left)  Patient location during evaluation: PACU Anesthesia Type: General Level of consciousness: awake and alert Pain management: pain level controlled Vital Signs Assessment: post-procedure vital signs reviewed and stable Respiratory status: spontaneous breathing, nonlabored ventilation, respiratory function stable and patient connected to nasal cannula oxygen Cardiovascular status: blood pressure returned to baseline and stable Postop Assessment: no signs of nausea or vomiting Anesthetic complications: no    Last Vitals:  Vitals:   04/01/16 1249 04/01/16 1323  BP:  (!) 123/54  Pulse:  (!) 103  Resp:  18  Temp: 36.8 C 36.9 C    Last Pain:  Vitals:   04/01/16 1323  TempSrc: Oral  PainSc:                  Martha Clan

## 2016-04-02 LAB — BASIC METABOLIC PANEL
Anion gap: 4 — ABNORMAL LOW (ref 5–15)
BUN: 13 mg/dL (ref 6–20)
CHLORIDE: 115 mmol/L — AB (ref 101–111)
CO2: 23 mmol/L (ref 22–32)
Calcium: 8.2 mg/dL — ABNORMAL LOW (ref 8.9–10.3)
Creatinine, Ser: 0.8 mg/dL (ref 0.44–1.00)
GFR calc Af Amer: >60 mL/min (ref >60)
GFR calc non Af Amer: >60 mL/min (ref >60)
GLUCOSE: 112 mg/dL — AB (ref 65–99)
POTASSIUM: 3.7 mmol/L (ref 3.5–5.1)
Sodium: 142 mmol/L (ref 135–145)

## 2016-04-02 LAB — CBC
HEMATOCRIT: 31.9 % — AB (ref 35.0–47.0)
HEMOGLOBIN: 10.9 g/dL — AB (ref 12.0–16.0)
MCH: 29.4 pg (ref 26.0–34.0)
MCHC: 34.3 g/dL (ref 32.0–36.0)
MCV: 85.9 fL (ref 80.0–100.0)
Platelets: 193 10*3/uL (ref 150–440)
RBC: 3.71 MIL/uL — AB (ref 3.80–5.20)
RDW: 13.5 % (ref 11.5–14.5)
WBC: 10.8 10*3/uL (ref 3.6–11.0)

## 2016-04-02 NOTE — Progress Notes (Signed)
[place] at [place] NAME: Julia Sandoval    MR#:  [id number]  DATE OF BIRTH:  11-03-1945  SUBJECTIVE:  CHIEF COMPLAINT:   Chief Complaint  Patient presents with  . Flank Pain  . Back Pain   Feels better. Afebrile. Ambulating. Poor appetite. Still has some dysuria. REVIEW OF SYSTEMS:  Review of Systems  Constitutional: Positive for chills, fever and malaise/fatigue.  HENT: Negative for sore throat.   Eyes: Negative for blurred vision and double vision.  Respiratory: Negative for cough, shortness of breath and wheezing.   Cardiovascular: Negative for chest pain, palpitations and leg swelling.  Gastrointestinal: Positive for nausea. Negative for abdominal pain, blood in stool, melena and vomiting.  Genitourinary: Negative for dysuria and flank pain.  Musculoskeletal: Negative for joint pain.  Skin: Negative for itching and rash.  Neurological: Negative for dizziness, seizures, loss of consciousness and headaches.  Psychiatric/Behavioral: Negative for depression. The patient is not nervous/anxious.     DRUG ALLERGIES:  No Known Allergies VITALS:  Blood pressure 137/68, pulse 80, temperature 97.9 F (36.6 C), temperature source Oral, resp. rate 20, height 5\' 4"  (1.626 m), weight 74.8 kg (165 lb), SpO2 96 %. PHYSICAL EXAMINATION:  Physical Exam  Constitutional: She is oriented to person, place, and time and well-developed, well-nourished, and in no distress. No distress.  HENT:  Head: Normocephalic.  Mouth/Throat: Oropharynx is clear and moist.  Eyes: Conjunctivae and EOM are normal. No scleral icterus.  Neck: No JVD present.  Cardiovascular: Normal rate, regular rhythm and normal heart sounds.  Exam reveals no gallop.   No murmur heard. Pulmonary/Chest: Effort normal and breath sounds normal. No respiratory distress. She has no wheezes.  Abdominal: Soft. Bowel sounds are normal. She exhibits no distension. There is no tenderness.    Musculoskeletal: Normal range of motion. She exhibits no edema or tenderness.  Lymphadenopathy:    She has no cervical adenopathy.  Neurological: She is alert and oriented to person, place, and time. No cranial nerve deficit. Gait normal.  Skin: Skin is warm and dry.  Psychiatric: Affect and judgment normal.   LABORATORY PANEL:   CBC  Recent Labs Lab 04/02/16 0603  WBC 10.8  HGB 10.9*  HCT 31.9*  PLT 193   ------------------------------------------------------------------------------------------------------------------ Chemistries   Recent Labs Lab 03/31/16 1245 04/01/16 0509 04/02/16 0603  NA 136 143 142  K 4.0 3.3* 3.7  CL 105 114* 115*  CO2 22 22 23   GLUCOSE 175* 220* 112*  BUN 15 16 13   CREATININE 1.06* 0.86 0.80  CALCIUM 9.1 8.3* 8.2*  MG  --  1.7  --   AST 29  --   --   ALT 20  --   --   ALKPHOS 66  --   --   BILITOT 1.2  --   --    RADIOLOGY:  No results found. ASSESSMENT AND PLAN:   70 year old female with past medical history of hypertension, depression, sent to the hospital due to left flank pain and nausea noted to have nephrolithiasis with associated pyelonephritis.  # Acute pyelonephritis With 9 mm left ureteropelvic junctional calculus causing moderate left-sided hydronephrosis. S/p left-sided ureteral stent. Urine cultures have Klebsiella. Sensitivities pending. Blood cultures sent on admission. Continue IV ceftriaxone Discharge once sensitivities available on urine cultures Urology follow-up as outpatient for stent removal  # Depression-continue Wellbutrin, Seroquel.  # Hypokalemia. Replaced  All the records are reviewed and case discussed with Care Management/Social Worker. Management plans  discussed with the patient, family and they are in agreement.  CODE STATUS: full code.  TOTAL TIME TAKING CARE OF THIS PATIENT: .30 minutes.   POSSIBLE D/C IN 1 DAY, DEPENDING ON cultures   Hillary Bow R M.D on 04/02/2016 at 1:13  PM  Between 7am to 6pm - Pager - [phone]  After 6pm go to [email or web address] - Proofreader  [facility]  Office  [phone]  CC: Primary care physician; BABAOFF, Caryl Bis, MD  Note: This dictation was prepared with Dragon dictation along with smaller phrase technology. Any transcriptional errors that result from this process are unintentional.

## 2016-04-03 LAB — HEMOGLOBIN A1C
Hgb A1c MFr Bld: 5.1 % (ref 4.8–5.6)
Mean Plasma Glucose: 100 mg/dL

## 2016-04-03 LAB — URINE CULTURE

## 2016-04-03 MED ORDER — CEPHALEXIN 250 MG PO CAPS: 250.0000 mg | ORAL_CAPSULE | Freq: Three times a day (TID) | ORAL | 0 refills | Status: DC

## 2016-04-03 NOTE — Discharge Instructions (Signed)
Follow with Urology clinic in 2 weeks.

## 2016-04-03 NOTE — Discharge Summary (Signed)
[place] at [place] NAME: Julia Sandoval    MR#:  [id number]  DATE OF BIRTH:  1945-09-19  DATE OF ADMISSION:  03/31/2016 ADMITTING PHYSICIAN: Henreitta Leber, MD  DATE OF DISCHARGE: 04/03/2016  PRIMARY CARE PHYSICIAN: BABAOFF, MARC E, MD    ADMISSION DIAGNOSIS:  Ureterolithiasis [N20.1] Pyelonephritis [N12] Acute kidney injury ([place]) [N17.9] Hydronephrosis with urinary obstruction due to ureteral calculus [N13.2] Sepsis, due to unspecified organism ([place]) [A41.9]  DISCHARGE DIAGNOSIS:  Active Problems:   Acute pyelonephritis   SECONDARY DIAGNOSIS:   Past Medical History:  Diagnosis Date  . Depression   . Hyperlipidemia    Mixed  . Hypertension    Unspecified  . LBBB (left bundle branch block)   . Obesity     HOSPITAL COURSE:   # Acute pyelonephritis With 9 mm left ureteropelvic junctional calculus causing moderate left-sided hydronephrosis. S/p left-sided ureteral stent. Urine cultures have Klebsiella. Sensitivities reviewed Blood cultures not sent on admission. Continue IV ceftriaxone  give keflex oral on discharge for 9 more days.  Urology follow-up as outpatient for stent removal  # Depression-continue Wellbutrin, Seroquel.  # Hypokalemia. Replaced   DISCHARGE CONDITIONS:   Stable.  CONSULTS OBTAINED:  Treatment Team:  Hollice Espy, MD Cleon Gustin, MD  DRUG ALLERGIES:  No Known Allergies  DISCHARGE MEDICATIONS:   Current Discharge Medication List    START taking these medications   Details  cephALEXin (KEFLEX) 250 MG capsule Take 1 capsule (250 mg total) by mouth 3 (three) times daily. Qty: 27 capsule, Refills: 0      CONTINUE these medications which have NOT CHANGED   Details  buPROPion (WELLBUTRIN XL) 300 MG 24 hr tablet Take 300 mg by mouth every morning.    Cholecalciferol (VITAMIN D3) 5000 units CAPS Take 1 capsule by mouth every morning.     Ginkgo Biloba (GINKGO PO)  Take 1 tablet by mouth every morning.     glucosamine-chondroitin 500-400 MG tablet Take 1 tablet by mouth 2 (two) times daily.    HYDROcodone-acetaminophen (NORCO) 5-325 MG tablet Take 1-2 tablets by mouth every 4 (four) hours as needed for moderate pain. Qty: 24 tablet, Refills: 0    ibuprofen (ADVIL,MOTRIN) 600 MG tablet Take 600 mg by mouth 2 (two) times daily. Pain      Multiple Vitamin (MULTIVITAMIN) tablet Take 1 tablet by mouth every morning.     Omega-3 Fatty Acids (FISH OIL PO) Take 1 capsule by mouth 2 (two) times daily.    QUEtiapine (SEROQUEL) 100 MG tablet Take 100 mg by mouth at bedtime.    raloxifene (EVISTA) 60 MG tablet Take 1 tablet (60 mg total) by mouth daily. Qty: 90 tablet, Refills: 2         DISCHARGE INSTRUCTIONS:    Follow with urology clinic in 2 weeks.  If you experience worsening of your admission symptoms, develop shortness of breath, life threatening emergency, suicidal or homicidal thoughts you must seek medical attention immediately by calling 911 or calling your MD immediately  if symptoms less severe.  You Must read complete instructions/literature along with all the possible adverse reactions/side effects for all the Medicines you take and that have been prescribed to you. Take any new Medicines after you have completely understood and accept all the possible adverse reactions/side effects.   Please note  You were cared for by a hospitalist during your hospital stay. If you have any questions about your discharge medications or the care  you received while you were in the hospital after you are discharged, you can call the unit and asked to speak with the hospitalist on call if the hospitalist that took care of you is not available. Once you are discharged, your primary care physician will handle any further medical issues. Please note that NO REFILLS for any discharge medications will be authorized once you are discharged, as it is imperative that  you return to your primary care physician (or establish a relationship with a primary care physician if you do not have one) for your aftercare needs so that they can reassess your need for medications and monitor your lab values.    Today   CHIEF COMPLAINT:   Chief Complaint  Patient presents with  . Flank Pain  . Back Pain    HISTORY OF PRESENT ILLNESS:  Julia Sandoval  is a 70 y.o. female with a known history of Depression, hypertension, hyperlipidemia, who presents to the hospital due to left flank pain that began yesterday. She describes the pain as being sharp in nature sometimes radiating down her left leg. She denies any hematuria, cloudy urine or difficulty urinating. She does say she had some episodes of nausea but no vomiting. He also complains of chills but no documented fever. Since the pain was not improving she came to the ER for further evaluation. Patient underwent a CT scan of her abdomen pelvis which showed a left-sided nephro lithiasis with associated acute pyelonephritis. Hospitalist services were For treatment and evaluation.   VITAL SIGNS:  Blood pressure 135/73, pulse 82, temperature 98.1 F (36.7 C), temperature source Oral, resp. rate 19, height 5\' 4"  (1.626 m), weight 74.8 kg (165 lb), SpO2 99 %.  I/O:   Intake/Output Summary (Last 24 hours) at 04/03/16 1332 Last data filed at 04/03/16 1214  Gross per 24 hour  Intake              960 ml  Output             2100 ml  Net            -1140 ml    PHYSICAL EXAMINATION:   Constitutional: She is oriented to person, place, and time and well-developed, well-nourished, and in no distress. No distress.  HENT:  Head: Normocephalic.  Mouth/Throat: Oropharynx is clear and moist.  Eyes: Conjunctivae and EOM are normal. No scleral icterus.  Neck: No JVD present.  Cardiovascular: Normal rate, regular rhythm and normal heart sounds.  Exam reveals no gallop.   No murmur heard. Pulmonary/Chest: Effort normal and breath  sounds normal. No respiratory distress. She has no wheezes.  Abdominal: Soft. Bowel sounds are normal. She exhibits no distension. There is no tenderness.  Musculoskeletal: Normal range of motion. She exhibits no edema or tenderness.  Lymphadenopathy:    She has no cervical adenopathy.  Neurological: She is alert and oriented to person, place, and time. No cranial nerve deficit. Gait normal.  Skin: Skin is warm and dry.  Psychiatric: Affect and judgment normal.   DATA REVIEW:   CBC  Recent Labs Lab 04/02/16 0603  WBC 10.8  HGB 10.9*  HCT 31.9*  PLT 193    Chemistries   Recent Labs Lab 03/31/16 1245 04/01/16 0509 04/02/16 0603  NA 136 143 142  K 4.0 3.3* 3.7  CL 105 114* 115*  CO2 22 22 23   GLUCOSE 175* 220* 112*  BUN 15 16 13   CREATININE 1.06* 0.86 0.80  CALCIUM 9.1 8.3* 8.2*  MG  --  1.7  --   AST 29  --   --   ALT 20  --   --   ALKPHOS 66  --   --   BILITOT 1.2  --   --     Cardiac Enzymes No results for input(s): TROPONINI in the last 168 hours.  Microbiology Results  Results for orders placed or performed during the hospital encounter of 03/31/16  Urine culture     Status: Abnormal   Collection Time: 03/31/16 12:45 PM  Result Value Ref Range Status   Specimen Description URINE, RANDOM  Final   Special Requests NONE  Final   Culture >=100,000 COLONIES/mL KLEBSIELLA PNEUMONIAE (A)  Final   Report Status 04/03/2016 FINAL  Final   Organism ID, Bacteria KLEBSIELLA PNEUMONIAE (A)  Final      Susceptibility   Klebsiella pneumoniae - MIC*    AMPICILLIN >=32 RESISTANT Resistant     CEFAZOLIN <=4 SENSITIVE Sensitive     CEFTRIAXONE <=1 SENSITIVE Sensitive     CIPROFLOXACIN <=0.25 SENSITIVE Sensitive     GENTAMICIN <=1 SENSITIVE Sensitive     IMIPENEM <=0.25 SENSITIVE Sensitive     NITROFURANTOIN 128 RESISTANT Resistant     TRIMETH/SULFA <=20 SENSITIVE Sensitive     AMPICILLIN/SULBACTAM 8 SENSITIVE Sensitive     PIP/TAZO <=4 SENSITIVE Sensitive      Extended ESBL NEGATIVE Sensitive     * >=100,000 COLONIES/mL KLEBSIELLA PNEUMONIAE  Anaerobic culture     Status: None (Preliminary result)   Collection Time: 03/31/16  5:50 PM  Result Value Ref Range Status   Specimen Description PELVIS  Final   Special Requests NONE  Final   Gram Stain PENDING  Incomplete   Culture   Final    NO ANAEROBES ISOLATED; CULTURE IN PROGRESS FOR 5 DAYS   Report Status PENDING  Incomplete  Gram stain     Status: None   Collection Time: 03/31/16  5:50 PM  Result Value Ref Range Status   Specimen Description PELVIS  Final   Special Requests NONE  Final   Gram Stain   Final    WBC PRESENT, PREDOMINANTLY PMN GRAM NEGATIVE RODS CYTOSPIN SMEAR Performed at [facility]    Report Status 03/31/2016 FINAL  Final    RADIOLOGY:  No results found.  EKG:   Orders placed or performed during the hospital encounter of 07/04/15  . EKG 12-Lead  . EKG 12-Lead  . EKG 12-Lead  . EKG 12-Lead  . EKG      Management plans discussed with the patient, family and they are in agreement.  CODE STATUS:     Code Status Orders        Start     Ordered   03/31/16 1855  Full code  Continuous     03/31/16 1854    Code Status History    Date Active Date Inactive Code Status Order ID Comments User Context   This patient has a current code status but no historical code status.      TOTAL TIME TAKING CARE OF THIS PATIENT: 35 minutes.    Vaughan Basta M.D on 04/03/2016 at 1:32 PM  Between 7am to 6pm - Pager - [phone]  After 6pm go to [email or web address] - password [email or web address]  Office  [phone]  CC: Primary care physician; BABAOFF, Caryl Bis, MD   Note: This dictation was prepared with Dragon dictation along with smaller phrase technology. Any transcriptional errors that  result from this process are unintentional.

## 2016-04-03 NOTE — Progress Notes (Signed)
Pt A and O x 4. VSS. Pt tolerating diet well. No complaints of pain or nausea. IV removed intact, prescriptions given. Pt voiced understanding of discharge instructions with no further questions. Pt discharged via wheelchair with axillary.   

## 2016-04-05 LAB — ANAEROBIC CULTURE

## 2016-04-07 ENCOUNTER — Telehealth: Payer: Self-pay

## 2016-04-07 NOTE — Telephone Encounter (Signed)
Pt called in stating she is having severe burning upon urination and is going to the bathroom every 5 minutes. Reinforced with pt with stent in place that is normal. Reinforced with pt to drink plenty of fluids. Pt voiced understanding.

## 2016-04-10 ENCOUNTER — Encounter: Payer: Self-pay | Admitting: Urology

## 2016-04-10 ENCOUNTER — Ambulatory Visit (INDEPENDENT_AMBULATORY_CARE_PROVIDER_SITE_OTHER): Payer: BLUE CROSS/BLUE SHIELD | Admitting: Urology

## 2016-04-10 VITALS — BP 134/78 | HR 86 | Ht 64.0 in | Wt 167.8 lb

## 2016-04-10 DIAGNOSIS — N201 Calculus of ureter: Secondary | ICD-10-CM | POA: Diagnosis not present

## 2016-04-10 MED ORDER — MELOXICAM 7.5 MG PO TABS: 15.0000 mg | ORAL_TABLET | Freq: Every day | ORAL | Status: DC

## 2016-04-10 MED ORDER — TAMSULOSIN HCL 0.4 MG PO CAPS: 0.4000 mg | ORAL_CAPSULE | Freq: Every day | ORAL | 0 refills | Status: DC

## 2016-04-10 NOTE — Progress Notes (Signed)
04/10/2016 3:57 PM   Julia Sandoval 1946/06/19 [id number]  Referring provider: [name], MD [address] [facility] [place], [place] [address]  Chief Complaint  Patient presents with  . Nephrolithiasis    HPI: Patient is a 70 year old Caucasian female who presents today for follow-up from hospitalization after an emergent left ureteral stent placement for a left UPJ stone with leukocytosis and concern for pyelonephritis.  Patient presented to [facility]'s emergency room on 03/31/2016 and was found to have a 9 mm left UPJ calculus with severe perinephric stranding with a WBC count of 11.5. She was having associated fevers, chills, nausea and vomiting. She underwent a left emergent ureteral stent placement with Dr. Alyson Ingles on 03/31/2016.    She was admitted for IV antibiotics and discharged on 04/03/2016.    Today, she is experiencing frequency, urgency, dysuria, nocturia, urge incontinence, intermittency, hesitancy and straining to urinate.  The symptoms are consistent with ureteral stent pain. She has not had any further fevers, chills, nausea or vomiting. She has not had any flank pain.  She has not passed any fragments.  I have reviewed the films with the patient.   PMH: Past Medical History:  Diagnosis Date  . Depression   . Hyperlipidemia    Mixed  . Hypertension    Unspecified  . LBBB (left bundle branch block)   . Obesity     Surgical History: Past Surgical History:  Procedure Laterality Date  . ANKLE FRACTURE SURGERY    . BREAST BIOPSY Right    neg bx  . BREAST SURGERY Right 07/04/2013   Stereotactic biopsy for asymmetric density and calcifications: Flat epithelial atypia, intraductal papilloma, fragmented.  Marland Kitchen BREAST SURGERY Right 09/06/13   Right breast wide excision, [facility] only  . CESAREAN SECTION    . CHOLECYSTECTOMY    . COLONOSCOPY  2013  . CYSTOSCOPY W/ RETROGRADES N/A 03/31/2016   Procedure: CYSTOSCOPY WITH RETROGRADE PYELOGRAM;  Surgeon: Cleon Gustin, MD;  Location: [facility] ORS;  Service: Urology;  Laterality: N/A;  . CYSTOSCOPY WITH STENT PLACEMENT Left 03/31/2016   Procedure: CYSTOSCOPY WITH STENT PLACEMENT;  Surgeon: Cleon Gustin, MD;  Location: [facility] ORS;  Service: Urology;  Laterality: Left;  . DILATION AND CURETTAGE OF UTERUS  2015  . GASTRIC BYPASS  2012  . SMALL INTESTINE SURGERY  07/2015   Accenditial cut-[facility]  . VENTRAL HERNIA REPAIR N/A 01/25/2016   Procedure: HERNIA REPAIR VENTRAL ADULT;  Surgeon: Leonie Green, MD;  Location: [facility] ORS;  Service: General;  Laterality: N/A;    Home Medications:    Medication List       Accurate as of 04/10/16  3:57 PM. Always use your most recent med list.          buPROPion 300 MG 24 hr tablet Commonly known as:  WELLBUTRIN XL Take 300 mg by mouth every morning.   calcium citrate 950 MG tablet Commonly known as:  CALCITRATE - dosed in mg elemental calcium Take 200 mg of elemental calcium by mouth daily.   cephALEXin 250 MG capsule Commonly known as:  KEFLEX Take 1 capsule (250 mg total) by mouth 3 (three) times daily.   FISH OIL PO Take 1 capsule by mouth 2 (two) times daily.   GINKGO PO Take 1 tablet by mouth every morning.   glucosamine-chondroitin 500-400 MG tablet Take 1 tablet by mouth 2 (two) times daily.   HYDROcodone-acetaminophen 5-325 MG tablet Commonly known  as:  NORCO Take 1-2 tablets by mouth every 4 (four) hours as needed for moderate pain.   ibuprofen 600 MG tablet Commonly known as:  ADVIL,MOTRIN Take 600 mg by mouth 2 (two) times daily. Pain   multivitamin tablet Take 1 tablet by mouth every morning.   QUEtiapine 100 MG tablet Commonly known as:  SEROQUEL Take 100 mg by mouth at bedtime.   raloxifene 60 MG tablet Commonly known as:  EVISTA Take 1 tablet (60 mg total) by mouth daily.   tamsulosin 0.4 MG Caps capsule Commonly known as:  FLOMAX Take 1 capsule  (0.4 mg total) by mouth daily.   Vitamin D3 5000 units Caps Take 1 capsule by mouth every morning.       Allergies: No Known Allergies  Family History: Family History  Problem Relation Age of Onset  . Stroke Other   . Heart disease Mother   . Alzheimer's disease Father   . Breast cancer Neg Hx   . Bladder Cancer Neg Hx   . Kidney cancer Neg Hx     Social History:  reports that she quit smoking about 17 years ago. Her smoking use included Cigarettes. She has a 5.00 pack-year smoking history. She has never used smokeless tobacco. She reports that she does not drink alcohol or use drugs.  ROS: UROLOGY Frequent Urination?: Yes Hard to postpone urination?: Yes Burning/pain with urination?: Yes Get up at night to urinate?: Yes Leakage of urine?: Yes Urine stream starts and stops?: Yes Trouble starting stream?: Yes Do you have to strain to urinate?: Yes Blood in urine?: No Urinary tract infection?: Yes Sexually transmitted disease?: No Injury to kidneys or bladder?: Yes Painful intercourse?: No Weak stream?: No Currently pregnant?: No Vaginal bleeding?: No Last menstrual period?: N  Gastrointestinal Nausea?: Yes Vomiting?: No Indigestion/heartburn?: Yes Diarrhea?: Yes Constipation?: No  Constitutional Fever: Yes Night sweats?: Yes Weight loss?: No Fatigue?: Yes  Skin Skin rash/lesions?: No Itching?: No  Eyes Blurred vision?: No Double vision?: No  Ears/Nose/Throat Sore throat?: No Sinus problems?: No  Hematologic/Lymphatic Swollen glands?: No Easy bruising?: Yes  Cardiovascular Leg swelling?: No Chest pain?: No  Respiratory Cough?: No Shortness of breath?: No  Endocrine Excessive thirst?: No  Musculoskeletal Back pain?: No Joint pain?: No  Neurological Headaches?: No Dizziness?: No  Psychologic Depression?: Yes Anxiety?: No  Physical Exam: BP 134/78 (BP Location: Left Arm, Patient Position: Sitting, Cuff Size: Normal)   Pulse  86   Ht 5\' 4"  (1.626 m)   Wt 167 lb 12.8 oz (76.1 kg)   BMI 28.80 kg/m   Constitutional: Well nourished. Alert and oriented, No acute distress. HEENT: [place] AT, moist mucus membranes. Trachea midline, no masses. Cardiovascular: No clubbing, cyanosis, or edema. Respiratory: Normal respiratory effort, no increased work of breathing. GI: Abdomen is soft, non tender, non distended, no abdominal masses. Liver and spleen not palpable.  No hernias appreciated.  Stool sample for occult testing is not indicated.   GU: No CVA tenderness.  No bladder fullness or masses.   Skin: No rashes, bruises or suspicious lesions. Lymph: No cervical or inguinal adenopathy. Neurologic: Grossly intact, no focal deficits, moving all 4 extremities. Psychiatric: Normal mood and affect.  Laboratory Data: Lab Results  Component Value Date   WBC 10.8 04/02/2016   HGB 10.9 (L) 04/02/2016   HCT 31.9 (L) 04/02/2016   MCV 85.9 04/02/2016   PLT 193 04/02/2016    Lab Results  Component Value Date   CREATININE 0.80 04/02/2016  Lab Results  Component Value Date   HGBA1C 5.1 04/02/2016    Lab Results  Component Value Date   TSH 2.64 10/17/2011     Lab Results  Component Value Date   AST 29 03/31/2016   Lab Results  Component Value Date   ALT 20 03/31/2016     Results for orders placed or performed in visit on 04/10/16  CULTURE, URINE COMPREHENSIVE  Result Value Ref Range   Urine Culture, Comprehensive Final report    Result 1 Comment   Microscopic Examination  Result Value Ref Range   WBC, UA 6-10 (A) 0 - 5 /hpf   RBC, UA >30 (A) 0 - 2 /hpf   Epithelial Cells (non renal) 0-10 0 - 10 /hpf   Bacteria, UA Few None seen/Few  Urinalysis, Complete  Result Value Ref Range   Specific Gravity, UA >1.030 (H) 1.005 - 1.030   pH, UA 5.0 5.0 - 7.5   Color, UA Amber (A) Yellow   Appearance Ur Cloudy (A) Clear   Leukocytes, UA Trace (A) Negative   Protein, UA 2+ (A) Negative/Trace   Glucose, UA  Negative Negative   Ketones, UA Trace (A) Negative   RBC, UA 3+ (A) Negative   Bilirubin, UA Negative Negative   Urobilinogen, Ur 0.2 0.2 - 1.0 mg/dL   Nitrite, UA Negative Negative   Microscopic Examination See below:   CBC with Differential/Platelet  Result Value Ref Range   WBC 7.0 3.4 - 10.8 x10E3/uL   RBC 4.06 3.77 - 5.28 x10E6/uL   Hemoglobin 11.6 11.1 - 15.9 g/dL   Hematocrit 35.3 34.0 - 46.6 %   MCV 87 79 - 97 fL   MCH 28.6 26.6 - 33.0 pg   MCHC 32.9 31.5 - 35.7 g/dL   RDW 13.5 12.3 - 15.4 %   Platelets 524 (H) 150 - 379 x10E3/uL   Neutrophils 56 Not Estab. %   Lymphs 32 Not Estab. %   Monocytes 6 Not Estab. %   Eos 5 Not Estab. %   Basos 0 Not Estab. %   Neutrophils Absolute 3.9 1.4 - 7.0 x10E3/uL   Lymphocytes Absolute 2.2 0.7 - 3.1 x10E3/uL   Monocytes Absolute 0.4 0.1 - 0.9 x10E3/uL   EOS (ABSOLUTE) 0.3 0.0 - 0.4 x10E3/uL   Basophils Absolute 0.0 0.0 - 0.2 x10E3/uL   Immature Granulocytes 1 Not Estab. %   Immature Grans (Abs) 0.1 0.0 - 0.1 [id number]  Basic metabolic panel  Result Value Ref Range   Glucose 125 (H) 65 - 99 mg/dL   BUN 16 8 - 27 mg/dL   Creatinine, Ser 0.84 0.57 - 1.00 mg/dL   GFR calc non Af Amer 71 >59 mL/min/1.73   GFR calc Af Amer 82 >59 mL/min/1.73   BUN/Creatinine Ratio 19 12 - 28   Sodium 148 (H) 134 - 144 mmol/L   Potassium 4.8 3.5 - 5.2 mmol/L   Chloride 107 (H) 96 - 106 mmol/L   CO2 26 18 - 29 mmol/L   Calcium 9.3 8.7 - 10.3 mg/dL    Pertinent Imaging: CLINICAL DATA:  Acute left flank pain.  EXAM: CT ABDOMEN AND PELVIS WITHOUT CONTRAST  TECHNIQUE: Multidetector CT imaging of the abdomen and pelvis was performed following the standard protocol without IV contrast.  COMPARISON:  CT scan of December 26, 2015.  FINDINGS: Lower chest: Visualized lung bases are unremarkable.  Hepatobiliary: Status post cholecystectomy. No focal abnormality seen in the liver on unenhanced images.  Pancreas: Normal.  Spleen:  Normal.  Adrenals/Urinary Tract: Adrenal glands appear normal. Right kidney and ureter appear normal. Moderate left hydronephrosis is noted secondary to 9 mm calculus at the left ureteropelvic junction. Significant perinephric stranding is noted. Urinary bladder is unremarkable.  Stomach/Bowel: Status post gastric bypass. Small bowel anastomotic sutures are noted in left lower quadrant. No abnormal bowel dilatation is noted. The appendix appears normal.  Vascular/Lymphatic: Atherosclerosis of abdominal aorta is noted without aneurysm formation. 11 mm left periaortic lymph node is noted which is significantly enlarged compared to prior exam most likely is inflammatory in origin given the adjacent inflammatory changes noted around the left kidney.  Reproductive: Uterus and ovaries are unremarkable.  Other: No abnormal fluid collection is noted.  Musculoskeletal: Multilevel degenerative disc disease is noted in the lower thoracic and lumbar spine.  IMPRESSION: Aortic atherosclerosis.  Moderate left hydronephrosis with perinephric stranding secondary to 9 mm calculus at left ureteropelvic junction.   Electronically Signed   By: Marijo Conception, M.D.   On: 03/31/2016 13:52   Assessment & Plan:    Patient will undergo a left ureteroscopy with laser lithotripsy with ureteral stent exchange for definitive treatment for a left UPJ stone.    1. Left UPJ stone  - schedule left ureteroscopy with laser lithotripsy and ureteral stent placement  - explained to the patient how the procedure is performed and the risks involved  - informed patient that they will have the stent exchanged during the procedure and will remain in place after the procedure for a short time.   - stent may be removed in the office with a cystoscope or patient may be instructed to remove the stent themselves by the string  - reassured patient that she is experiencing "stent pain" as feelings of needing  to urinate/overactive bladder and a warm, tingling sensation to intense pain in the affected flank- given Vesicare samples and tamsulosin prescription sent to pharmacy  - residual stones within the kidney or ureter may be present after the procedure and may need to have these addressed at a different encounter  - injury to the ureter is the most common intra-operative risk, it may result in an open procedure to correct the defect  - infection and bleeding are also risks  - explained the risks of general anesthesia, such as: MI, CVA, paralysis, coma and/or death.  - advised to contact our office or seek treatment in the ED if becomes febrile or pain/ vomiting are difficult control in order to arrange for emergent/urgent intervention  - Urinalysis, Complete  - CULTURE, URINE COMPREHENSIVE  - CBC with Differential/Platelet  - Basic metabolic panel   Return for left URS/LL/ureteral stent exchange .  These notes generated with voice recognition software. I apologize for typographical errors.  Zara Council, [facility] [address] [place],  [address] [phone]

## 2016-04-11 LAB — BASIC METABOLIC PANEL
BUN / CREAT RATIO: 19 (ref 12–28)
BUN: 16 mg/dL (ref 8–27)
CO2: 26 mmol/L (ref 18–29)
Calcium: 9.3 mg/dL (ref 8.7–10.3)
Chloride: 107 mmol/L — ABNORMAL HIGH (ref 96–106)
Creatinine, Ser: 0.84 mg/dL (ref 0.57–1.00)
GFR calc Af Amer: 82 mL/min/{1.73_m2} (ref >59)
GFR calc non Af Amer: 71 mL/min/{1.73_m2} (ref >59)
GLUCOSE: 125 mg/dL — AB (ref 65–99)
POTASSIUM: 4.8 mmol/L (ref 3.5–5.2)
SODIUM: 148 mmol/L — AB (ref 134–144)

## 2016-04-11 LAB — CBC WITH DIFFERENTIAL/PLATELET
BASOS: 0 %
Basophils Absolute: 0 10*3/uL (ref 0.0–0.2)
EOS (ABSOLUTE): 0.3 10*3/uL (ref 0.0–0.4)
EOS: 5 %
Hematocrit: 35.3 % (ref 34.0–46.6)
Hemoglobin: 11.6 g/dL (ref 11.1–15.9)
IMMATURE GRANS (ABS): 0.1 10*3/uL (ref 0.0–0.1)
IMMATURE GRANULOCYTES: 1 %
LYMPHS ABS: 2.2 10*3/uL (ref 0.7–3.1)
Lymphs: 32 %
MCH: 28.6 pg (ref 26.6–33.0)
MCHC: 32.9 g/dL (ref 31.5–35.7)
MCV: 87 fL (ref 79–97)
MONOCYTES: 6 %
MONOS ABS: 0.4 10*3/uL (ref 0.1–0.9)
Neutrophils Absolute: 3.9 10*3/uL (ref 1.4–7.0)
Neutrophils: 56 %
PLATELETS: 524 10*3/uL — AB (ref 150–379)
RBC: 4.06 x10E6/uL (ref 3.77–5.28)
RDW: 13.5 % (ref 12.3–15.4)
WBC: 7 10*3/uL (ref 3.4–10.8)

## 2016-04-11 LAB — URINALYSIS, COMPLETE
Bilirubin, UA: NEGATIVE
GLUCOSE, UA: NEGATIVE
Nitrite, UA: NEGATIVE
PH UA: 5 (ref 5.0–7.5)
Specific Gravity, UA: >1.03 — ABNORMAL HIGH (ref 1.005–1.030)
Urobilinogen, Ur: 0.2 mg/dL (ref 0.2–1.0)

## 2016-04-11 LAB — MICROSCOPIC EXAMINATION

## 2016-04-13 LAB — CULTURE, URINE COMPREHENSIVE

## 2016-04-15 ENCOUNTER — Telehealth: Payer: Self-pay | Admitting: Radiology

## 2016-04-15 ENCOUNTER — Other Ambulatory Visit: Payer: Self-pay | Admitting: Radiology

## 2016-04-15 DIAGNOSIS — N200 Calculus of kidney: Secondary | ICD-10-CM

## 2016-04-15 NOTE — Telephone Encounter (Signed)
Notified pt of surgery scheduled 04/28/16, pre-admit testing appt on 04/16/16 @9 :45 & to call Friday prior to surgery for arrival time to SDS. Pt requests earlier surgery date. Will notify pt if there is a cancellation prior to 04/28/16. Pt voices understanding.

## 2016-04-16 ENCOUNTER — Encounter
Admission: RE | Admit: 2016-04-16 | Discharge: 2016-04-16 | Disposition: A | Discharge status: Home / Self Care | Payer: BLUE CROSS/BLUE SHIELD | Source: Ambulatory Visit | Attending: Urology | Admitting: Urology

## 2016-04-16 DIAGNOSIS — R9431 Abnormal electrocardiogram [ECG] [EKG]: Secondary | ICD-10-CM | POA: Diagnosis not present

## 2016-04-16 DIAGNOSIS — I447 Left bundle-branch block, unspecified: Secondary | ICD-10-CM | POA: Diagnosis not present

## 2016-04-16 DIAGNOSIS — N130 Hydronephrosis with ureteropelvic junction obstruction: Secondary | ICD-10-CM | POA: Diagnosis not present

## 2016-04-16 DIAGNOSIS — Z01810 Encounter for preprocedural cardiovascular examination: Secondary | ICD-10-CM | POA: Diagnosis present

## 2016-04-16 HISTORY — DX: Calculus of kidney: N20.0

## 2016-04-16 NOTE — Patient Instructions (Signed)
  Your procedure is scheduled on: 04/28/16 Mon Report to Same Day Surgery 2nd floor medical mall To find out your arrival time please call [phone] between 1PM - 3PM on 10/13/17Fri  Remember: Instructions that are not followed completely may result in serious medical risk, up to and including death, or upon the discretion of your surgeon and anesthesiologist your surgery may need to be rescheduled.    _x___ 1. Do not eat food or drink liquids after midnight. No gum chewing or hard candies.     __x__ 2. No Alcohol for 24 hours before or after surgery.   __x__3. No Smoking for 24 prior to surgery.   ____  4. Bring all medications with you on the day of surgery if instructed.    __x__ 5. Notify your doctor if there is any change in your medical condition     (cold, fever, infections).     Do not wear jewelry, make-up, hairpins, clips or nail polish.  Do not wear lotions, powders, or perfumes. You may wear deodorant.  Do not shave 48 hours prior to surgery. Men may shave face and neck.  Do not bring valuables to the hospital.    [facility] is not responsible for any belongings or valuables.               Contacts, dentures or bridgework may not be worn into surgery.  Leave your suitcase in the car. After surgery it may be brought to your room.  For patients admitted to the hospital, discharge time is determined by your treatment team.   Patients discharged the day of surgery will not be allowed to drive home.    Please read over the following fact sheets that you were given:   [facility] Preparing for Surgery and or MRSA Information   _x___ Take these medicines the morning of surgery with A SIP OF WATER:    1. None  2.  3.  4.  5.  6.  ____Fleets enema or Magnesium Citrate as directed.   _x___ Use CHG Soap or sage wipes as directed on instruction sheet   ____ Use inhalers on the day of surgery and bring to hospital day of surgery  ____ Stop metformin 2 days prior to  surgery    ____ Take 1/2 of usual insulin dose the night before surgery and none on the morning of           surgery.   ____ Stop aspirin or coumadin, or plavix  x__ Stop Anti-inflammatories such as Advil, Aleve, Ibuprofen, Motrin, Naproxen,          Naprosyn, Goodies powders or aspirin products. Ok to take Tylenol.   _x___ Stop supplements until after surgery.  Fish oil, Ginkgo Biloba  ____ Bring C-Pap to the hospital.

## 2016-04-17 ENCOUNTER — Encounter: Payer: Self-pay | Admitting: Surgery

## 2016-04-17 ENCOUNTER — Telehealth: Payer: Self-pay | Admitting: Radiology

## 2016-04-17 ENCOUNTER — Other Ambulatory Visit: Payer: Self-pay | Admitting: Urology

## 2016-04-17 MED ORDER — OXYCODONE-ACETAMINOPHEN 10-325 MG PO TABS: 1.0000 | ORAL_TABLET | ORAL | 0 refills | Status: DC | PRN

## 2016-04-17 NOTE — Telephone Encounter (Signed)
Pt requests pain medication that can be taken until surgery scheduled 04/28/16. Pt is currently taking meloxicam which will need to be held 7 days prior to surgery. LMOM that Prescription for percocet left at front desk. Advised pt to use percocet for severe pain.

## 2016-04-24 ENCOUNTER — Other Ambulatory Visit: Payer: Self-pay

## 2016-04-24 DIAGNOSIS — N200 Calculus of kidney: Secondary | ICD-10-CM

## 2016-04-24 MED ORDER — OXYCODONE-ACETAMINOPHEN 10-325 MG PO TABS: 1.0000 | ORAL_TABLET | ORAL | 0 refills | Status: DC | PRN

## 2016-04-24 NOTE — Progress Notes (Signed)
Pt called requesting more pain medication. Per Larene Beach 5 tabs were given. Pt will come to office to pick up medication.

## 2016-04-28 ENCOUNTER — Ambulatory Visit: Payer: BLUE CROSS/BLUE SHIELD | Admitting: Anesthesiology

## 2016-04-28 ENCOUNTER — Encounter: Payer: Self-pay | Admitting: Anesthesiology

## 2016-04-28 ENCOUNTER — Ambulatory Visit
Admission: RE | Admit: 2016-04-28 | Discharge: 2016-04-28 | Disposition: A | Discharge status: Home / Self Care | Payer: BLUE CROSS/BLUE SHIELD | Source: Ambulatory Visit | Attending: Urology | Admitting: Urology

## 2016-04-28 ENCOUNTER — Encounter
Admission: RE | Disposition: A | Discharge status: Home / Self Care | Payer: Self-pay | Source: Ambulatory Visit | Attending: Urology

## 2016-04-28 DIAGNOSIS — Z87891 Personal history of nicotine dependence: Secondary | ICD-10-CM | POA: Insufficient documentation

## 2016-04-28 DIAGNOSIS — I10 Essential (primary) hypertension: Secondary | ICD-10-CM | POA: Insufficient documentation

## 2016-04-28 DIAGNOSIS — E782 Mixed hyperlipidemia: Secondary | ICD-10-CM | POA: Insufficient documentation

## 2016-04-28 DIAGNOSIS — E669 Obesity, unspecified: Secondary | ICD-10-CM | POA: Diagnosis not present

## 2016-04-28 DIAGNOSIS — N132 Hydronephrosis with renal and ureteral calculous obstruction: Secondary | ICD-10-CM | POA: Insufficient documentation

## 2016-04-28 DIAGNOSIS — I700 Atherosclerosis of aorta: Secondary | ICD-10-CM | POA: Insufficient documentation

## 2016-04-28 DIAGNOSIS — N200 Calculus of kidney: Secondary | ICD-10-CM

## 2016-04-28 DIAGNOSIS — N201 Calculus of ureter: Secondary | ICD-10-CM | POA: Diagnosis not present

## 2016-04-28 HISTORY — PX: URETEROSCOPY WITH HOLMIUM LASER LITHOTRIPSY: SHX6645

## 2016-04-28 HISTORY — PX: CYSTOSCOPY W/ URETERAL STENT PLACEMENT: SHX1429

## 2016-04-28 SURGERY — URETEROSCOPY, WITH LITHOTRIPSY USING HOLMIUM LASER
Anesthesia: General | Laterality: Left | Wound class: Clean Contaminated

## 2016-04-28 MED ORDER — FENTANYL CITRATE (PF) 100 MCG/2ML IJ SOLN
INTRAMUSCULAR | Status: AC
  Administered 2016-04-28: 25 ug via INTRAVENOUS
  Filled 2016-04-28: qty 2

## 2016-04-28 MED ORDER — OXYBUTYNIN CHLORIDE 5 MG PO TABS: 5.0000 mg | ORAL_TABLET | Freq: Three times a day (TID) | ORAL | 0 refills | Status: DC | PRN

## 2016-04-28 MED ORDER — IOTHALAMATE MEGLUMINE 43 % IV SOLN
INTRAVENOUS | Status: DC | PRN
  Administered 2016-04-28: 20 mL

## 2016-04-28 MED ORDER — CEFAZOLIN SODIUM-DEXTROSE 2-4 GM/100ML-% IV SOLN
2.0000 g | INTRAVENOUS | Status: AC
  Administered 2016-04-28: 2 g via INTRAVENOUS

## 2016-04-28 MED ORDER — DEXAMETHASONE SODIUM PHOSPHATE 10 MG/ML IJ SOLN
INTRAMUSCULAR | Status: DC | PRN
  Administered 2016-04-28: 10 mg via INTRAVENOUS

## 2016-04-28 MED ORDER — LIDOCAINE 2% (20 MG/ML) 5 ML SYRINGE
INTRAMUSCULAR | Status: DC | PRN
  Administered 2016-04-28: 80 mg via INTRAVENOUS

## 2016-04-28 MED ORDER — DOCUSATE SODIUM 100 MG PO CAPS: 100.0000 mg | ORAL_CAPSULE | Freq: Two times a day (BID) | ORAL | 0 refills | Status: DC

## 2016-04-28 MED ORDER — FENTANYL CITRATE (PF) 100 MCG/2ML IJ SOLN
25.0000 ug | INTRAMUSCULAR | Status: DC | PRN
  Administered 2016-04-28 (×4): 25 ug via INTRAVENOUS

## 2016-04-28 MED ORDER — OXYCODONE-ACETAMINOPHEN 10-325 MG PO TABS: 1.0000 | ORAL_TABLET | ORAL | 0 refills | Status: DC | PRN

## 2016-04-28 MED ORDER — LACTATED RINGERS IV SOLN
INTRAVENOUS | Status: DC
  Administered 2016-04-28 (×2): via INTRAVENOUS

## 2016-04-28 MED ORDER — ROCURONIUM BROMIDE 100 MG/10ML IV SOLN
INTRAVENOUS | Status: DC | PRN
  Administered 2016-04-28: 5 mg via INTRAVENOUS

## 2016-04-28 MED ORDER — FAMOTIDINE 20 MG PO TABS
20.0000 mg | ORAL_TABLET | Freq: Once | ORAL | Status: AC
  Administered 2016-04-28: 20 mg via ORAL

## 2016-04-28 MED ORDER — ONDANSETRON HCL 4 MG/2ML IJ SOLN
INTRAMUSCULAR | Status: DC | PRN
  Administered 2016-04-28: 4 mg via INTRAVENOUS

## 2016-04-28 MED ORDER — FENTANYL CITRATE (PF) 100 MCG/2ML IJ SOLN
INTRAMUSCULAR | Status: DC | PRN
  Administered 2016-04-28 (×3): 50 ug via INTRAVENOUS

## 2016-04-28 MED ORDER — METOPROLOL TARTRATE 5 MG/5ML IV SOLN
INTRAVENOUS | Status: DC | PRN
  Administered 2016-04-28: 1 mg via INTRAVENOUS

## 2016-04-28 MED ORDER — TAMSULOSIN HCL 0.4 MG PO CAPS: 0.4000 mg | ORAL_CAPSULE | Freq: Every day | ORAL | 0 refills | Status: DC

## 2016-04-28 MED ORDER — SUCCINYLCHOLINE CHLORIDE 20 MG/ML IJ SOLN
INTRAMUSCULAR | Status: DC | PRN
  Administered 2016-04-28: 80 mg via INTRAVENOUS

## 2016-04-28 MED ORDER — ONDANSETRON HCL 4 MG/2ML IJ SOLN: 4.0000 mg | Freq: Once | INTRAMUSCULAR | Status: DC | PRN

## 2016-04-28 MED ORDER — FAMOTIDINE 20 MG PO TABS
ORAL_TABLET | ORAL | Status: AC
  Filled 2016-04-28: qty 1

## 2016-04-28 MED ORDER — OXYBUTYNIN CHLORIDE 5 MG PO TABS
5.0000 mg | ORAL_TABLET | Freq: Three times a day (TID) | ORAL | Status: DC | PRN
  Administered 2016-04-28: 5 mg via ORAL
  Filled 2016-04-28 (×2): qty 1

## 2016-04-28 MED ORDER — CEFAZOLIN SODIUM-DEXTROSE 2-4 GM/100ML-% IV SOLN
INTRAVENOUS | Status: AC
  Filled 2016-04-28: qty 100

## 2016-04-28 MED ORDER — PROPOFOL 10 MG/ML IV BOLUS
INTRAVENOUS | Status: DC | PRN
  Administered 2016-04-28: 100 mg via INTRAVENOUS

## 2016-04-28 MED ORDER — OXYCODONE-ACETAMINOPHEN 7.5-325 MG PO TABS
1.0000 | ORAL_TABLET | Freq: Once | ORAL | Status: AC
  Administered 2016-04-28: 1 via ORAL

## 2016-04-28 MED ORDER — MIDAZOLAM HCL 2 MG/2ML IJ SOLN
INTRAMUSCULAR | Status: DC | PRN
  Administered 2016-04-28: 2 mg via INTRAVENOUS

## 2016-04-28 MED ORDER — OXYCODONE-ACETAMINOPHEN 7.5-325 MG PO TABS
ORAL_TABLET | ORAL | Status: AC
  Filled 2016-04-28: qty 1

## 2016-04-28 SURGICAL SUPPLY — 33 items
ADAPTER SCOPE UROLOK II (MISCELLANEOUS) IMPLANT
BAG DRAIN CYSTO-URO LG1000N (MISCELLANEOUS) ×2 IMPLANT
BASKET ZERO TIP 1.9FR (BASKET) ×2 IMPLANT
CATH FOL 2WAY LX 16X5 (CATHETERS) IMPLANT
CATH URETL 5X70 OPEN END (CATHETERS) ×2 IMPLANT
CNTNR SPEC 2.5X3XGRAD LEK (MISCELLANEOUS) ×1
CONRAY 43 FOR UROLOGY 50M (MISCELLANEOUS) ×2 IMPLANT
CONT SPEC 4OZ STER OR WHT (MISCELLANEOUS) ×1
CONTAINER SPEC 2.5X3XGRAD LEK (MISCELLANEOUS) ×1 IMPLANT
DRAPE UTILITY 15X26 TOWEL STRL (DRAPES) ×2 IMPLANT
FIBER LASER LITHO 273 (Laser) ×2 IMPLANT
GLOVE BIO SURGEON STRL SZ 6.5 (GLOVE) ×4 IMPLANT
GOWN STRL REUS W/ TWL LRG LVL3 (GOWN DISPOSABLE) ×2 IMPLANT
GOWN STRL REUS W/ TWL LRG LVL4 (GOWN DISPOSABLE) ×2 IMPLANT
GOWN STRL REUS W/TWL LRG LVL3 (GOWN DISPOSABLE) ×2
GOWN STRL REUS W/TWL LRG LVL4 (GOWN DISPOSABLE) ×2
GUIDEWIRE GREEN .038 145CM (MISCELLANEOUS) ×2 IMPLANT
GUIDEWIRE SUPER STIFF (WIRE) ×2 IMPLANT
HOLDER FOLEY CATH W/STRAP (MISCELLANEOUS) IMPLANT
INTRODUCER DILATOR DOUBLE (INTRODUCER) IMPLANT
KIT RM TURNOVER CYSTO AR (KITS) ×2 IMPLANT
PACK CYSTO AR (MISCELLANEOUS) ×2 IMPLANT
SENSORWIRE 0.038 NOT ANGLED (WIRE) ×2
SET CYSTO W/LG BORE CLAMP LF (SET/KITS/TRAYS/PACK) ×2 IMPLANT
SHEATH URETERAL 12FRX35CM (MISCELLANEOUS) ×2 IMPLANT
SOL .9 NS 3000ML IRR  AL (IV SOLUTION) ×1
SOL .9 NS 3000ML IRR UROMATIC (IV SOLUTION) ×1 IMPLANT
STENT URET 6FRX24 CONTOUR (STENTS) ×2 IMPLANT
STENT URET 6FRX26 CONTOUR (STENTS) IMPLANT
SURGILUBE 2OZ TUBE FLIPTOP (MISCELLANEOUS) ×2 IMPLANT
SYRINGE IRR TOOMEY STRL 70CC (SYRINGE) ×2 IMPLANT
WATER STERILE IRR 1000ML POUR (IV SOLUTION) ×2 IMPLANT
WIRE SENSOR 0.038 NOT ANGLED (WIRE) ×1 IMPLANT

## 2016-04-28 NOTE — Transfer of Care (Signed)
Immediate Anesthesia Transfer of Care Note  Patient: Julia Sandoval  Procedure(s) Performed: Procedure(s): URETEROSCOPY WITH HOLMIUM LASER LITHOTRIPSY (Left) CYSTOSCOPY WITH STENT REPLACEMENT (Left)  Patient Location: PACU  Anesthesia Type:General  Level of Consciousness: sedated  Airway & Oxygen Therapy: Patient Spontanous Breathing and Patient connected to face mask oxygen  Post-op Assessment: Report given to RN and Post -op Vital signs reviewed and stable  Post vital signs: Reviewed and stable  Last Vitals:  Vitals:   04/28/16 1024 04/28/16 1219  BP: 129/65 (!) 149/68  Pulse: 98 79  Resp: 16 18  Temp: 36.8 C [id number] C    Complications: No apparent anesthesia complications

## 2016-04-28 NOTE — Interval H&P Note (Signed)
History and Physical Interval Note:  04/28/2016 10:34 AM  Julia Sandoval  has presented today for surgery, with the diagnosis of LEFT URETERAL STONE  The various methods of treatment have been discussed with the patient and family. After consideration of risks, benefits and other options for treatment, the patient has consented to  Procedure(s): URETEROSCOPY WITH HOLMIUM LASER LITHOTRIPSY (Left) CYSTOSCOPY WITH STENT REPLACEMENT (Left) as a surgical intervention .  The patient's history has been reviewed, patient examined, no change in status, stable for surgery.  I have reviewed the patient's chart and labs.  Questions were answered to the patient's satisfaction.    RRR CTAB  Hollice Espy

## 2016-04-28 NOTE — Op Note (Signed)
Date of procedure: 04/28/16  Preoperative diagnosis:  1. Left UPJ stone   Postoperative diagnosis:  1. Same as above   Procedure: 1. Left ureteroscopy 2. Laser lithotripsy 3. Left ureteral stent exchange 4. Left retrograde pyelogram 5. Basket extraction of stone fragment  Surgeon: Hollice Espy, MD  Anesthesia: General  Complications: None  Intraoperative findings: 9 millimeter UPJ stone slightly embedded into mucosa. No extravasation.  EBL: minimal  Specimens: stone fragement  Drains: 6 x 24 Fr JJ ureteral stent on left  Indication: Julia Sandoval is a 70 y.o. patient with 9 mm left UPJ stone initially presenting with concerns for sepsis who underwent emergent ureteral stent placement. She returns today for definitive management of her stone.  After reviewing the management options for treatment, she elected to proceed with the above surgical procedure(s). We have discussed the potential benefits and risks of the procedure, side effects of the proposed treatment, the likelihood of the patient achieving the goals of the procedure, and any potential problems that might occur during the procedure or recuperation. Informed consent has been obtained.  Description of procedure:  The patient was taken to the operating room and general anesthesia was induced.  The patient was placed in the dorsal lithotomy position, prepped and draped in the usual sterile fashion, and preoperative antibiotics were administered. A preoperative time-out was performed.   A 21 French rigid cystoscope was advanced per urethra into the bladder. Attention was turned to the left ureteral orifice from which a ureteral stent was seen emanating. The distal most clamp the stent was grasped and brought to level of the urethral meatus. The stent was then cannulated using a sensor wire up to level of the kidney. The 9 mm calcification could be seen at the level of the UPJ.  A dual-lumen access sheath was used just  within the distal ureter under fluoroscopic guidance first chip perform a retrograde power gram which showed no hydroureteronephrosis extravasation or filling defects other than the aforementioned stone. A Super Stiff wire was then introduced all the way up to level of the kidney as a working wire. The sensor wires to place a safety wire. A Cook 12/14 ureteral access sheath was then advanced just distal to the ureteral calcification without difficulty over the Super Stiff wire and the inner cannula was removed. A Wolf 8 [place] dual lumen ureteroscope was then advanced to level of the stone without difficulty. The stone was embedded partially into the wall of the ureter at this location somewhat posterior and medially. The 273  laser fiber was then brought in and using the studies at 0.8 J and 12 Hz, the stone was fractured into 2 pieces. One piece his pressure into a midpole calyx. The second piece was able to be mobilized using a 0 tip 9 basket out of the ureteral mucosa into a lower pole calyx. The laser fiber settings were then adjusted to 0.2 J and 40 Hz to dust each of the stone pieces. The larger versions of the stone were extracted using a nitinol basket. These are passed off the field as stone specimen. Once the kidney was visually cleared of all stone, the scope was backed to the proximal ureter. A second retrograde pyelogram was performed which showed no extravasation and created a roadmap of the renal pelvis. Again, each every calyx was directed visualized and there is no residual stone fragment appreciated. The scope was then brought back down the length of the ureter removing the access sheath along the  way. There is no ureteral trauma or injuries appreciated as well as no additional ureteral stone fragments. A 6 x 24 French double-J ureteral stent was then advanced over the safety wire up to the level of the upper pole calyx.  The wire was then fully withdrawn and a full" within this calyx  As as well  as within the bladder. The bladder was drained using the cystoscope sheath. The patient was cleaned and dried, repositioned supine position, emerged from anesthesia, taken to PACU in stable condition.  Plan: Patient will follow up in 1 week for cystoscopy, stent removal.   Hollice Espy, M.D.

## 2016-04-28 NOTE — Anesthesia Postprocedure Evaluation (Signed)
Anesthesia Post Note  Patient: Julia Sandoval  Procedure(s) Performed: Procedure(s) (LRB): URETEROSCOPY WITH HOLMIUM LASER LITHOTRIPSY (Left) CYSTOSCOPY WITH STENT REPLACEMENT (Left)  Patient location during evaluation: PACU Anesthesia Type: General Level of consciousness: awake and alert Pain management: pain level controlled Vital Signs Assessment: post-procedure vital signs reviewed and stable Respiratory status: spontaneous breathing, nonlabored ventilation, respiratory function stable and patient connected to nasal cannula oxygen Cardiovascular status: blood pressure returned to baseline and stable Postop Assessment: no signs of nausea or vomiting Anesthetic complications: no    Last Vitals:  Vitals:   04/28/16 1331 04/28/16 1402  BP: (!) 146/66 140/66  Pulse: 77 81  Resp: 16   Temp: 37 C     Last Pain:  Vitals:   04/28/16 1426  TempSrc:   PainSc: 2                  Tawania Daponte S

## 2016-04-28 NOTE — Discharge Instructions (Signed)
AMBULATORY SURGERY  DISCHARGE INSTRUCTIONS   1) The drugs that you were given will stay in your system until tomorrow so for the next 24 hours you should not:  A) Drive an automobile B) Make any legal decisions C) Drink any alcoholic beverage   2) You may resume regular meals tomorrow.  Today it is better to start with liquids and gradually work up to solid foods.  You may eat anything you prefer, but it is better to start with liquids, then soup and crackers, and gradually work up to solid foods.   3) Please notify your doctor immediately if you have any unusual bleeding, trouble breathing, redness and pain at the surgery site, drainage, fever, or pain not relieved by medication.    4) Additional Instructions:        Please contact your physician with any problems or Same Day Surgery at [phone], Monday through Friday 6 am to 4 pm, or [place] at [facility] number at [phone].You have a ureteral stent in place.  This is a tube that extends from your kidney to your bladder.  This may cause urinary bleeding, burning with urination, and urinary frequency.  Please call our office or present to the ED if you develop fevers >101 or pain which is not able to be controlled with oral pain medications.  You may be given either Flomax and/ or ditropan to help with bladder spasms and stent pain in addition to pain medications.    [facility] [address] [place], [place] [address] [phone]

## 2016-04-28 NOTE — Anesthesia Preprocedure Evaluation (Signed)
Anesthesia Evaluation  Patient identified by MRN, date of birth, ID band Patient awake    Reviewed: Allergy & Precautions, NPO status , Patient's Chart, lab work & pertinent test results, reviewed documented beta blocker date and time   Airway Mallampati: II  TM Distance: >3 FB     Dental  (+) Chipped   Pulmonary shortness of breath, former smoker,           Cardiovascular hypertension, Pt. on medications + dysrhythmias      Neuro/Psych PSYCHIATRIC DISORDERS Anxiety Depression    GI/Hepatic   Endo/Other    Renal/GU Renal disease     Musculoskeletal   Abdominal   Peds  Hematology   Anesthesia Other Findings Gastric bypass 2012. Has had LBBB.  Reproductive/Obstetrics                             Anesthesia Physical Anesthesia Plan  ASA: III  Anesthesia Plan: General   Post-op Pain Management:    Induction: Intravenous  Airway Management Planned: Oral ETT  Additional Equipment:   Intra-op Plan:   Post-operative Plan:   Informed Consent: I have reviewed the patients History and Physical, chart, labs and discussed the procedure including the risks, benefits and alternatives for the proposed anesthesia with the patient or authorized representative who has indicated his/her understanding and acceptance.     Plan Discussed with: CRNA  Anesthesia Plan Comments:         Anesthesia Quick Evaluation

## 2016-04-28 NOTE — H&P (View-Only) (Signed)
04/10/2016 3:57 PM   Julia Sandoval 1945-12-27 [id number]  Referring provider: [name], MD [address] [facility] [place], [place] [address]  Chief Complaint  Patient presents with  . Nephrolithiasis    HPI: Patient is a 70 year old Caucasian female who presents today for follow-up from hospitalization after an emergent left ureteral stent placement for a left UPJ stone with leukocytosis and concern for pyelonephritis.  Patient presented to [facility]'s emergency room on 03/31/2016 and was found to have a 9 mm left UPJ calculus with severe perinephric stranding with a WBC count of 11.5. She was having associated fevers, chills, nausea and vomiting. She underwent a left emergent ureteral stent placement with Dr. Alyson Ingles on 03/31/2016.    She was admitted for IV antibiotics and discharged on 04/03/2016.    Today, she is experiencing frequency, urgency, dysuria, nocturia, urge incontinence, intermittency, hesitancy and straining to urinate.  The symptoms are consistent with ureteral stent pain. She has not had any further fevers, chills, nausea or vomiting. She has not had any flank pain.  She has not passed any fragments.  I have reviewed the films with the patient.   PMH: Past Medical History:  Diagnosis Date  . Depression   . Hyperlipidemia    Mixed  . Hypertension    Unspecified  . LBBB (left bundle branch block)   . Obesity     Surgical History: Past Surgical History:  Procedure Laterality Date  . ANKLE FRACTURE SURGERY    . BREAST BIOPSY Right    neg bx  . BREAST SURGERY Right 07/04/2013   Stereotactic biopsy for asymmetric density and calcifications: Flat epithelial atypia, intraductal papilloma, fragmented.  Marland Kitchen BREAST SURGERY Right 09/06/13   Right breast wide excision, ADH only  . CESAREAN SECTION    . CHOLECYSTECTOMY    . COLONOSCOPY  2013  . CYSTOSCOPY W/ RETROGRADES N/A 03/31/2016   Procedure: CYSTOSCOPY WITH RETROGRADE PYELOGRAM;  Surgeon: Cleon Gustin, MD;  Location: [facility] ORS;  Service: Urology;  Laterality: N/A;  . CYSTOSCOPY WITH STENT PLACEMENT Left 03/31/2016   Procedure: CYSTOSCOPY WITH STENT PLACEMENT;  Surgeon: Cleon Gustin, MD;  Location: [facility] ORS;  Service: Urology;  Laterality: Left;  . DILATION AND CURETTAGE OF UTERUS  2015  . GASTRIC BYPASS  2012  . SMALL INTESTINE SURGERY  07/2015   Accenditial cut-[facility]  . VENTRAL HERNIA REPAIR N/A 01/25/2016   Procedure: HERNIA REPAIR VENTRAL ADULT;  Surgeon: Leonie Green, MD;  Location: [facility] ORS;  Service: General;  Laterality: N/A;    Home Medications:    Medication List       Accurate as of 04/10/16  3:57 PM. Always use your most recent med list.          buPROPion 300 MG 24 hr tablet Commonly known as:  WELLBUTRIN XL Take 300 mg by mouth every morning.   calcium citrate 950 MG tablet Commonly known as:  CALCITRATE - dosed in mg elemental calcium Take 200 mg of elemental calcium by mouth daily.   cephALEXin 250 MG capsule Commonly known as:  KEFLEX Take 1 capsule (250 mg total) by mouth 3 (three) times daily.   FISH OIL PO Take 1 capsule by mouth 2 (two) times daily.   GINKGO PO Take 1 tablet by mouth every morning.   glucosamine-chondroitin 500-400 MG tablet Take 1 tablet by mouth 2 (two) times daily.   HYDROcodone-acetaminophen 5-325 MG tablet Commonly known  as:  NORCO Take 1-2 tablets by mouth every 4 (four) hours as needed for moderate pain.   ibuprofen 600 MG tablet Commonly known as:  ADVIL,MOTRIN Take 600 mg by mouth 2 (two) times daily. Pain   multivitamin tablet Take 1 tablet by mouth every morning.   QUEtiapine 100 MG tablet Commonly known as:  SEROQUEL Take 100 mg by mouth at bedtime.   raloxifene 60 MG tablet Commonly known as:  EVISTA Take 1 tablet (60 mg total) by mouth daily.   tamsulosin 0.4 MG Caps capsule Commonly known as:  FLOMAX Take 1 capsule  (0.4 mg total) by mouth daily.   Vitamin D3 5000 units Caps Take 1 capsule by mouth every morning.       Allergies: No Known Allergies  Family History: Family History  Problem Relation Age of Onset  . Stroke Other   . Heart disease Mother   . Alzheimer's disease Father   . Breast cancer Neg Hx   . Bladder Cancer Neg Hx   . Kidney cancer Neg Hx     Social History:  reports that she quit smoking about 17 years ago. Her smoking use included Cigarettes. She has a 5.00 pack-year smoking history. She has never used smokeless tobacco. She reports that she does not drink alcohol or use drugs.  ROS: UROLOGY Frequent Urination?: Yes Hard to postpone urination?: Yes Burning/pain with urination?: Yes Get up at night to urinate?: Yes Leakage of urine?: Yes Urine stream starts and stops?: Yes Trouble starting stream?: Yes Do you have to strain to urinate?: Yes Blood in urine?: No Urinary tract infection?: Yes Sexually transmitted disease?: No Injury to kidneys or bladder?: Yes Painful intercourse?: No Weak stream?: No Currently pregnant?: No Vaginal bleeding?: No Last menstrual period?: N  Gastrointestinal Nausea?: Yes Vomiting?: No Indigestion/heartburn?: Yes Diarrhea?: Yes Constipation?: No  Constitutional Fever: Yes Night sweats?: Yes Weight loss?: No Fatigue?: Yes  Skin Skin rash/lesions?: No Itching?: No  Eyes Blurred vision?: No Double vision?: No  Ears/Nose/Throat Sore throat?: No Sinus problems?: No  Hematologic/Lymphatic Swollen glands?: No Easy bruising?: Yes  Cardiovascular Leg swelling?: No Chest pain?: No  Respiratory Cough?: No Shortness of breath?: No  Endocrine Excessive thirst?: No  Musculoskeletal Back pain?: No Joint pain?: No  Neurological Headaches?: No Dizziness?: No  Psychologic Depression?: Yes Anxiety?: No  Physical Exam: BP 134/78 (BP Location: Left Arm, Patient Position: Sitting, Cuff Size: Normal)   Pulse  86   Ht 5\' 4"  (1.626 m)   Wt 167 lb 12.8 oz (76.1 kg)   BMI 28.80 kg/m   Constitutional: Well nourished. Alert and oriented, No acute distress. HEENT: [place] AT, moist mucus membranes. Trachea midline, no masses. Cardiovascular: No clubbing, cyanosis, or edema. Respiratory: Normal respiratory effort, no increased work of breathing. GI: Abdomen is soft, non tender, non distended, no abdominal masses. Liver and spleen not palpable.  No hernias appreciated.  Stool sample for occult testing is not indicated.   GU: No CVA tenderness.  No bladder fullness or masses.   Skin: No rashes, bruises or suspicious lesions. Lymph: No cervical or inguinal adenopathy. Neurologic: Grossly intact, no focal deficits, moving all 4 extremities. Psychiatric: Normal mood and affect.  Laboratory Data: Lab Results  Component Value Date   WBC 10.8 04/02/2016   HGB 10.9 (L) 04/02/2016   HCT 31.9 (L) 04/02/2016   MCV 85.9 04/02/2016   PLT 193 04/02/2016    Lab Results  Component Value Date   CREATININE 0.80 04/02/2016  Lab Results  Component Value Date   HGBA1C 5.1 04/02/2016    Lab Results  Component Value Date   TSH 2.64 10/17/2011     Lab Results  Component Value Date   AST 29 03/31/2016   Lab Results  Component Value Date   ALT 20 03/31/2016     Results for orders placed or performed in visit on 04/10/16  CULTURE, URINE COMPREHENSIVE  Result Value Ref Range   Urine Culture, Comprehensive Final report    Result 1 Comment   Microscopic Examination  Result Value Ref Range   WBC, UA 6-10 (A) 0 - 5 /hpf   RBC, UA >30 (A) 0 - 2 /hpf   Epithelial Cells (non renal) 0-10 0 - 10 /hpf   Bacteria, UA Few None seen/Few  Urinalysis, Complete  Result Value Ref Range   Specific Gravity, UA >1.030 (H) 1.005 - 1.030   pH, UA 5.0 5.0 - 7.5   Color, UA Amber (A) Yellow   Appearance Ur Cloudy (A) Clear   Leukocytes, UA Trace (A) Negative   Protein, UA 2+ (A) Negative/Trace   Glucose, UA  Negative Negative   Ketones, UA Trace (A) Negative   RBC, UA 3+ (A) Negative   Bilirubin, UA Negative Negative   Urobilinogen, Ur 0.2 0.2 - 1.0 mg/dL   Nitrite, UA Negative Negative   Microscopic Examination See below:   CBC with Differential/Platelet  Result Value Ref Range   WBC 7.0 3.4 - 10.8 x10E3/uL   RBC 4.06 3.77 - 5.28 x10E6/uL   Hemoglobin 11.6 11.1 - 15.9 g/dL   Hematocrit 35.3 34.0 - 46.6 %   MCV 87 79 - 97 fL   MCH 28.6 26.6 - 33.0 pg   MCHC 32.9 31.5 - 35.7 g/dL   RDW 13.5 12.3 - 15.4 %   Platelets 524 (H) 150 - 379 x10E3/uL   Neutrophils 56 Not Estab. %   Lymphs 32 Not Estab. %   Monocytes 6 Not Estab. %   Eos 5 Not Estab. %   Basos 0 Not Estab. %   Neutrophils Absolute 3.9 1.4 - 7.0 x10E3/uL   Lymphocytes Absolute 2.2 0.7 - 3.1 x10E3/uL   Monocytes Absolute 0.4 0.1 - 0.9 x10E3/uL   EOS (ABSOLUTE) 0.3 0.0 - 0.4 x10E3/uL   Basophils Absolute 0.0 0.0 - 0.2 x10E3/uL   Immature Granulocytes 1 Not Estab. %   Immature Grans (Abs) 0.1 0.0 - 0.1 [id number]  Basic metabolic panel  Result Value Ref Range   Glucose 125 (H) 65 - 99 mg/dL   BUN 16 8 - 27 mg/dL   Creatinine, Ser 0.84 0.57 - 1.00 mg/dL   GFR calc non Af Amer 71 >59 mL/min/1.73   GFR calc Af Amer 82 >59 mL/min/1.73   BUN/Creatinine Ratio 19 12 - 28   Sodium 148 (H) 134 - 144 mmol/L   Potassium 4.8 3.5 - 5.2 mmol/L   Chloride 107 (H) 96 - 106 mmol/L   CO2 26 18 - 29 mmol/L   Calcium 9.3 8.7 - 10.3 mg/dL    Pertinent Imaging: CLINICAL DATA:  Acute left flank pain.  EXAM: CT ABDOMEN AND PELVIS WITHOUT CONTRAST  TECHNIQUE: Multidetector CT imaging of the abdomen and pelvis was performed following the standard protocol without IV contrast.  COMPARISON:  CT scan of December 26, 2015.  FINDINGS: Lower chest: Visualized lung bases are unremarkable.  Hepatobiliary: Status post cholecystectomy. No focal abnormality seen in the liver on unenhanced images.  Pancreas: Normal.  Spleen:  Normal.  Adrenals/Urinary Tract: Adrenal glands appear normal. Right kidney and ureter appear normal. Moderate left hydronephrosis is noted secondary to 9 mm calculus at the left ureteropelvic junction. Significant perinephric stranding is noted. Urinary bladder is unremarkable.  Stomach/Bowel: Status post gastric bypass. Small bowel anastomotic sutures are noted in left lower quadrant. No abnormal bowel dilatation is noted. The appendix appears normal.  Vascular/Lymphatic: Atherosclerosis of abdominal aorta is noted without aneurysm formation. 11 mm left periaortic lymph node is noted which is significantly enlarged compared to prior exam most likely is inflammatory in origin given the adjacent inflammatory changes noted around the left kidney.  Reproductive: Uterus and ovaries are unremarkable.  Other: No abnormal fluid collection is noted.  Musculoskeletal: Multilevel degenerative disc disease is noted in the lower thoracic and lumbar spine.  IMPRESSION: Aortic atherosclerosis.  Moderate left hydronephrosis with perinephric stranding secondary to 9 mm calculus at left ureteropelvic junction.   Electronically Signed   By: Marijo Conception, M.D.   On: 03/31/2016 13:52   Assessment & Plan:    Patient will undergo a left ureteroscopy with laser lithotripsy with ureteral stent exchange for definitive treatment for a left UPJ stone.    1. Left UPJ stone  - schedule left ureteroscopy with laser lithotripsy and ureteral stent placement  - explained to the patient how the procedure is performed and the risks involved  - informed patient that they will have the stent exchanged during the procedure and will remain in place after the procedure for a short time.   - stent may be removed in the office with a cystoscope or patient may be instructed to remove the stent themselves by the string  - reassured patient that she is experiencing "stent pain" as feelings of needing  to urinate/overactive bladder and a warm, tingling sensation to intense pain in the affected flank- given Vesicare samples and tamsulosin prescription sent to pharmacy  - residual stones within the kidney or ureter may be present after the procedure and may need to have these addressed at a different encounter  - injury to the ureter is the most common intra-operative risk, it may result in an open procedure to correct the defect  - infection and bleeding are also risks  - explained the risks of general anesthesia, such as: MI, CVA, paralysis, coma and/or death.  - advised to contact our office or seek treatment in the ED if becomes febrile or pain/ vomiting are difficult control in order to arrange for emergent/urgent intervention  - Urinalysis, Complete  - CULTURE, URINE COMPREHENSIVE  - CBC with Differential/Platelet  - Basic metabolic panel   Return for left URS/LL/ureteral stent exchange .  These notes generated with voice recognition software. I apologize for typographical errors.  Zara Council, [facility] [address] [place], [place] [address] [phone]

## 2016-04-29 ENCOUNTER — Encounter: Payer: Self-pay | Admitting: Urology

## 2016-04-29 ENCOUNTER — Telehealth: Payer: Self-pay

## 2016-04-29 NOTE — Telephone Encounter (Signed)
Write her out.  Remind her to take ditropan.  If not working, can pick up mybetriq 50 samples.    Hollice Espy, MD

## 2016-04-29 NOTE — Telephone Encounter (Signed)
LMOM

## 2016-04-29 NOTE — Telephone Encounter (Signed)
Spoke with pt in reference to note for work and [employer]. Pt voiced understanding stating she will come pick up medication and note.

## 2016-04-29 NOTE — Telephone Encounter (Signed)
Pt called requesting to be written out of work for another week. Pt states that she is in the bathroom every hour and that is just simply not acceptable. Reinforced with pt the purpose of the stent and common side effects. Pt once again stated this is not acceptable. Please advise.

## 2016-05-08 LAB — STONE ANALYSIS
CA OXALATE, MONOHYDR.: 95 %
CA PHOS CRY STONE QL IR: 5 %
Stone Weight KSTONE: 40 mg

## 2016-05-09 ENCOUNTER — Ambulatory Visit (INDEPENDENT_AMBULATORY_CARE_PROVIDER_SITE_OTHER): Payer: BLUE CROSS/BLUE SHIELD | Admitting: Urology

## 2016-05-09 DIAGNOSIS — N200 Calculus of kidney: Secondary | ICD-10-CM

## 2016-05-09 LAB — MICROSCOPIC EXAMINATION
BACTERIA UA: NONE SEEN
RBC, UA: >30 /hpf — AB (ref 0–?)
WBC, UA: >30 /hpf — AB (ref 0–?)

## 2016-05-09 LAB — URINALYSIS, COMPLETE
Bilirubin, UA: NEGATIVE
Glucose, UA: NEGATIVE
Ketones, UA: NEGATIVE
Nitrite, UA: POSITIVE — AB
PH UA: 5.5 (ref 5.0–7.5)
Specific Gravity, UA: >1.03 — ABNORMAL HIGH (ref 1.005–1.030)
Urobilinogen, Ur: 0.2 mg/dL (ref 0.2–1.0)

## 2016-05-09 MED ORDER — SULFAMETHOXAZOLE-TRIMETHOPRIM 800-160 MG PO TABS: 1.0000 | ORAL_TABLET | Freq: Two times a day (BID) | ORAL | 0 refills | Status: DC

## 2016-05-09 MED ORDER — LIDOCAINE HCL 2 % EX GEL: 1.0000 "application " | Freq: Once | CUTANEOUS | Status: DC

## 2016-05-09 MED ORDER — CIPROFLOXACIN HCL 500 MG PO TABS: 500.0000 mg | ORAL_TABLET | Freq: Once | ORAL | Status: DC

## 2016-05-09 NOTE — Progress Notes (Signed)
Patient presented today for cystoscopy stent removal postoperatively. Unfortunately, her urine is frankly positive for infection including positive for nitrites.    She is having significant urinary issues including urgency, frequency, pain with urination and pressure. Unclear whether the symptoms are related to UTI or the stent itself. No fevers or chills.   As such, we'll prescribe antibiotics, send urine culture today, and rescheduled for early next week. She is agreeable with this plan.  Hollice Espy, MD

## 2016-05-12 ENCOUNTER — Ambulatory Visit (INDEPENDENT_AMBULATORY_CARE_PROVIDER_SITE_OTHER): Payer: BLUE CROSS/BLUE SHIELD | Admitting: Urology

## 2016-05-12 ENCOUNTER — Encounter: Payer: Self-pay | Admitting: Urology

## 2016-05-12 VITALS — BP 123/73 | HR 79 | Ht 64.0 in | Wt 165.0 lb

## 2016-05-12 DIAGNOSIS — N201 Calculus of ureter: Secondary | ICD-10-CM | POA: Insufficient documentation

## 2016-05-12 DIAGNOSIS — N3001 Acute cystitis with hematuria: Secondary | ICD-10-CM

## 2016-05-12 LAB — MICROSCOPIC EXAMINATION
Epithelial Cells (non renal): >10 /hpf — ABNORMAL HIGH (ref 0–10)
RBC, UA: >30 /hpf — ABNORMAL HIGH (ref 0–?)

## 2016-05-12 LAB — URINALYSIS, COMPLETE
BILIRUBIN UA: NEGATIVE
Glucose, UA: NEGATIVE
KETONES UA: NEGATIVE
Nitrite, UA: POSITIVE — AB
PH UA: 5.5 (ref 5.0–7.5)
UUROB: 0.2 mg/dL (ref 0.2–1.0)

## 2016-05-12 MED ORDER — CEFTRIAXONE SODIUM 1 G IJ SOLR
1.0000 g | Freq: Once | INTRAMUSCULAR | Status: AC
  Administered 2016-05-12: 1 g via INTRAMUSCULAR

## 2016-05-12 MED ORDER — LIDOCAINE HCL 2 % EX GEL: 1.0000 "application " | Freq: Once | CUTANEOUS | Status: DC

## 2016-05-12 NOTE — Progress Notes (Signed)
   05/12/16  CC:  Chief Complaint  Patient presents with  . Cysto Stent Removal    HPI:   70 year-old female who presents today for cystoscopy, stent removal.  She was taken to the operating room on 04/28/2016 for treatment of a 9 mm UPJ stone was partially embedded in the mucosa. She returned on Friday to the office for cystoscopy, stent removal however her urine was nitrate positive. She is been on Bactrim since. Urine culture still pending growing GNR.  Unfortunately today, her UA continues to appear grossly positive including positive nitrites. She denies any fevers or chills. She is feeling significant malaise and symptoms from her stent.  Blood pressure 123/73, pulse 79, height 5\' 4"  (1.626 m), weight 165 lb (74.8 kg). NED. A&Ox3.   No respiratory distress   Abd soft, NT, ND  Results for orders placed or performed in visit on 05/12/16  Microscopic Examination  Result Value Ref Range   WBC, UA >30 (H) 0 - 5 /hpf   RBC, UA >30 (H) 0 - 2 /hpf   Epithelial Cells (non renal) >10 (H) 0 - 10 /hpf   Bacteria, UA Many (A) None seen/Few  Urinalysis, Complete  Result Value Ref Range   Specific Gravity, UA >1.030 (H) 1.005 - 1.030   pH, UA 5.5 5.0 - 7.5   Color, UA Yellow Yellow   Appearance Ur Cloudy (A) Clear   Leukocytes, UA 1+ (A) Negative   Protein, UA 3+ (A) Negative/Trace   Glucose, UA Negative Negative   Ketones, UA Negative Negative   RBC, UA 3+ (A) Negative   Bilirubin, UA Negative Negative   Urobilinogen, Ur 0.2 0.2 - 1.0 mg/dL   Nitrite, UA Positive (A) Negative   Microscopic Examination See below:     Component     Latest Ref Rng & Units 05/09/2016  Organism      Gram negative rods (A)    Assessment/ Plan:  1. Left ureteral stone  Recommend deferring stent removal today again insetting of grossly positive UA She is agreeable to this plan: Return Friday for stent removal - Urinalysis, Complete - lidocaine (XYLOCAINE) 2 % jelly 1 application; Place 1  application into the urethra once. - US Renal; Future  2. Urinary tract infection with hemautria  Currently on Bactrim, given her UA today, suspect that this may not be the correct antibiotic No signs or symptoms of systemic infection Ceftriaxone given today 1 We will adjust antibiotics tomorrow or Wednesday based on culture and sensitivity data Symptoms reviewed, if fever 101 or greater, should present to the emergency room  -ceftriaxone 1 gm IM today x 1  Return in about 1 month (around 06/11/2016) for f/u RUS.  Hollice Espy, MD

## 2016-05-15 ENCOUNTER — Telehealth: Payer: Self-pay

## 2016-05-15 LAB — CULTURE, URINE COMPREHENSIVE

## 2016-05-15 NOTE — Telephone Encounter (Signed)
LMOM- have been on correct abx. See tomorrow for stent pull.

## 2016-05-15 NOTE — Telephone Encounter (Signed)
-----   Message from Hollice Espy, MD sent at 05/15/2016  8:15 AM EST ----- Please let this patient know that she's been on the correct antibiotics all along which is great news.  Most likely, its just taking time for her infection to resolve. We'll see her tomorrow for stent pull.

## 2016-05-16 ENCOUNTER — Ambulatory Visit (INDEPENDENT_AMBULATORY_CARE_PROVIDER_SITE_OTHER): Payer: BLUE CROSS/BLUE SHIELD | Admitting: Urology

## 2016-05-16 ENCOUNTER — Encounter: Payer: Self-pay | Admitting: Urology

## 2016-05-16 VITALS — BP 139/73 | HR 75 | Ht 64.0 in | Wt 165.0 lb

## 2016-05-16 DIAGNOSIS — N3001 Acute cystitis with hematuria: Secondary | ICD-10-CM

## 2016-05-16 DIAGNOSIS — N201 Calculus of ureter: Secondary | ICD-10-CM

## 2016-05-16 LAB — MICROSCOPIC EXAMINATION: RBC, UA: >30 /hpf — AB (ref 0–?)

## 2016-05-16 LAB — URINALYSIS, COMPLETE
BILIRUBIN UA: NEGATIVE
GLUCOSE, UA: NEGATIVE
KETONES UA: NEGATIVE
NITRITE UA: NEGATIVE
UUROB: 0.2 mg/dL (ref 0.2–1.0)
pH, UA: 5.5 (ref 5.0–7.5)

## 2016-05-16 MED ORDER — CIPROFLOXACIN HCL 500 MG PO TABS
500.0000 mg | ORAL_TABLET | Freq: Once | ORAL | Status: AC
  Administered 2016-05-16: 500 mg via ORAL

## 2016-05-16 MED ORDER — LIDOCAINE HCL 2 % EX GEL
1.0000 "application " | Freq: Once | CUTANEOUS | Status: AC
  Administered 2016-05-16: 1 via URETHRAL

## 2016-05-16 NOTE — Progress Notes (Signed)
   05/16/16  CC:  Chief Complaint  Patient presents with  . Cysto Stent Removal    HPI:   70 year old female status post left ureteroscopy who returns today for cystoscopy, stent removal. She was treated in the interim for urinary tract infection which ultimately grew Klebsiella. UA today appears fairly unremarkable.  She denies any fevers or chills.  Blood pressure 139/73, pulse 75, height 5\' 4"  (1.626 m), weight 165 lb (74.8 kg). NED. A&Ox3.   No respiratory distress   Abd soft, NT, ND Normal external genitalia with patent urethral meatus  Cystoscopy/ Stent removal procedure  Patient identification was confirmed, informed consent was obtained, and patient was prepped using Betadine solution.  Lidocaine jelly was administered per urethral meatus.    Preoperative abx where received prior to procedure.    Procedure: - Flexible cystoscope introduced, without any difficulty.   - Thorough search of the bladder revealed:    normal urethral meatus  Stent seen emanating from left ureteral orifice, grasped with stent graspers, and removed in entirety.    Post-Procedure: - Patient tolerated the procedure well   Assessment/ Plan:  1. Left ureteral stone S/p uncomplicated stent removal Warning symptoms reviewed today in detail Follow-up in 4 weeks with a renal ultrasound  2. Acute cystitis with hematuria Treated appropriately with antibiotics, last dose today - Urinalysis, Complete - ciprofloxacin (CIPRO) tablet 500 mg; Take 1 tablet (500 mg total) by mouth once. - lidocaine (XYLOCAINE) 2 % jelly 1 application; Place 1 application into the urethra once. - US Renal; Future  Return in about 4 weeks (around 06/13/2016) for f/u RUS.  Hollice Espy, MD

## 2016-05-20 ENCOUNTER — Telehealth: Payer: Self-pay | Admitting: *Deleted

## 2016-05-20 NOTE — Telephone Encounter (Signed)
We had discussed radiologists recommendation for a six month follow up, and at the time of her Spring 2017 appointment had agreed to a one year follow up.  If she is concerned, which I am not, she can have a right breast mammogram at this time. No OV required.

## 2016-05-20 NOTE — Telephone Encounter (Signed)
Patient called concerning her follow up mammogram. She states she was notified by [facility] she was supposed to have a mammogram in September. According to her last visit, she was to follow up in one year which would be 2018. She is in our recalls. She states she does not remember this and would like to go ahead and get a mammogram if you agree. Please advise. Thank you.

## 2016-05-21 ENCOUNTER — Other Ambulatory Visit: Payer: Self-pay | Admitting: General Surgery

## 2016-05-21 DIAGNOSIS — N6091 Unspecified benign mammary dysplasia of right breast: Secondary | ICD-10-CM

## 2016-05-21 NOTE — Telephone Encounter (Signed)
Contacted patient and she wishes to proceed with mammogram. Order placed and she is scheduled 06/13/16 for a mammogram. Thanks

## 2016-06-10 ENCOUNTER — Ambulatory Visit
Admission: RE | Admit: 2016-06-10 | Discharge: 2016-06-10 | Disposition: A | Discharge status: Home / Self Care | Payer: BLUE CROSS/BLUE SHIELD | Source: Ambulatory Visit | Attending: Urology | Admitting: Urology

## 2016-06-10 DIAGNOSIS — N3001 Acute cystitis with hematuria: Secondary | ICD-10-CM | POA: Diagnosis not present

## 2016-06-11 ENCOUNTER — Ambulatory Visit: Payer: BLUE CROSS/BLUE SHIELD | Admitting: Urology

## 2016-06-12 ENCOUNTER — Ambulatory Visit (INDEPENDENT_AMBULATORY_CARE_PROVIDER_SITE_OTHER): Payer: BLUE CROSS/BLUE SHIELD | Admitting: Urology

## 2016-06-12 ENCOUNTER — Inpatient Hospital Stay
Admission: RE | Admit: 2016-06-12 | Discharge: 2016-06-12 | Disposition: A | Discharge status: Home / Self Care | Payer: Medicare Other | Source: Ambulatory Visit

## 2016-06-12 VITALS — BP 160/79 | HR 87 | Ht 64.0 in | Wt 162.0 lb

## 2016-06-12 DIAGNOSIS — N201 Calculus of ureter: Secondary | ICD-10-CM

## 2016-06-12 DIAGNOSIS — N3001 Acute cystitis with hematuria: Secondary | ICD-10-CM | POA: Diagnosis not present

## 2016-06-12 NOTE — Pre-Procedure Instructions (Signed)
Echocardiogram W Colorflow Spectral Doppler12/30/2016 [facility] Component Name Value Ref Range  LV Diastolic Diameter PLAX 5.2 cm  LV Systolic Diameter PLAX 3.8 cm  IVS Diastolic Thickness 0.9 cm  LVPW Diastolic Thickness 0.9 cm  LA Systolic Diameter LX 4.3 cm  LA Volume 144.1 cm3  LA Area 4C View 19.7 cm2  LA Area 2C View 25.6 cm2  Aortic Root Diameter 2.8 cm  AV Peak Velocity 2.16 m/s  AV Peak Gradient 18.66   PV Peak Velocity 1.3 m/s  PV peak gradient 6.76 mmHg   Aorta at Sinuses Diameter 2.8 cm  Result Narrative   Borderline left ventricular systolic function, ejection fraction 50 to  55%  Mitral regurgitation - mild  Dilated left atrium - mild  Aortic sclerosis  Normal right ventricular systolic function  Status Results Details

## 2016-06-12 NOTE — Progress Notes (Signed)
06/12/2016 3:44 PM   Julia Sandoval Oct 03, 1945 [id number]  Referring provider: [name], MD [address] [facility] [place], [place] [address]  Chief Complaint  Patient presents with  . Results    14month w/u/s results    HPI: 70 year old female with history of gastric bypass surgery who returns today for follow-up status post left ureteroscopy for a 9 mm UPJ stone which was partially embedded within the mucosa.  She initially presented with signs and symptoms of sepsis and underwent ureteral stent placement. She'll return to the operating room on 04/28/2016 for definitive management of her stone. Her stent was subsequently removed after she was treated for urinary tract infection.  She states that since her stent has been removed, she is a new person. She feels quite well and denies any flank pain. She has no urinary symptoms today including no dysuria, gross hematuria, urgency, or frequency.  Stone analysis shows stone composition 95% calcium oxalate, 5% calcium phosphate.  Due to her history of gastric bypass, she is unable to drink significant volumes of fluid.  She denies a personal history of stones prior to this event.   PMH: Past Medical History:  Diagnosis Date  . Depression   . Hyperlipidemia    Mixed  . Hypertension    Unspecified  . Kidney stone   . LBBB (left bundle branch block)   . Obesity     Surgical History: Past Surgical History:  Procedure Laterality Date  . ANKLE FRACTURE SURGERY    . BREAST BIOPSY Right    neg bx  . BREAST SURGERY Right 07/04/2013   Stereotactic biopsy for asymmetric density and calcifications: Flat epithelial atypia, intraductal papilloma, fragmented.  Marland Kitchen BREAST SURGERY Right 09/06/13   Right breast wide excision, ADH only  . CESAREAN SECTION    . CHOLECYSTECTOMY    . COLONOSCOPY  2013  . CYSTOSCOPY W/ RETROGRADES N/A 03/31/2016   Procedure: CYSTOSCOPY WITH RETROGRADE PYELOGRAM;   Surgeon: Cleon Gustin, MD;  Location: [facility];  Service: Urology;  Laterality: N/A;  . CYSTOSCOPY W/ URETERAL STENT PLACEMENT Left 04/28/2016   Procedure: CYSTOSCOPY WITH STENT REPLACEMENT;  Surgeon: Hollice Espy, MD;  Location: [facility];  Service: Urology;  Laterality: Left;  . CYSTOSCOPY WITH STENT PLACEMENT Left 03/31/2016   Procedure: CYSTOSCOPY WITH STENT PLACEMENT;  Surgeon: Cleon Gustin, MD;  Location: [facility];  Service: Urology;  Laterality: Left;  . DILATION AND CURETTAGE OF UTERUS  2015  . GASTRIC BYPASS  2012  . SMALL INTESTINE SURGERY  07/2015   Accenditial cut-[facility]  . URETEROSCOPY WITH HOLMIUM LASER LITHOTRIPSY Left 04/28/2016   Procedure: URETEROSCOPY WITH HOLMIUM LASER LITHOTRIPSY;  Surgeon: Hollice Espy, MD;  Location: [facility];  Service: Urology;  Laterality: Left;  Marland Kitchen VENTRAL HERNIA REPAIR N/A 01/25/2016   Procedure: HERNIA REPAIR VENTRAL ADULT;  Surgeon: Leonie Green, MD;  Location: [facility];  Service: General;  Laterality: N/A;    Home Medications:    Medication List       Accurate as of 06/12/16  3:44 PM. Always use your most recent med list.          acetaminophen 500 MG tablet Commonly known as:  TYLENOL Take 1,000 mg by mouth every 6 (six) hours as needed (for pain.).   buPROPion 300 MG 24 hr tablet Commonly known as:  WELLBUTRIN XL Take 300 mg by mouth every morning.   calcium citrate 950 MG tablet Commonly known as:  CALCITRATE - dosed in mg elemental calcium Take 200 mg of elemental calcium by mouth daily.   FISH OIL PO Take 1 capsule by mouth 2 (two) times daily.   glucosamine-chondroitin 500-400 MG tablet Take 1 tablet by mouth 2 (two) times daily.   ibuprofen 600 MG tablet Commonly known as:  ADVIL,MOTRIN Take 600 mg by mouth 2 (two) times daily as needed (for pain.). Pain   multivitamin tablet Take 1 tablet by mouth every morning.   PROBIOTIC-10 PO Take 1 capsule by mouth 2 (two) times daily.   QUEtiapine 100 MG  tablet Commonly known as:  SEROQUEL Take 100 mg by mouth at bedtime.   raloxifene 60 MG tablet Commonly known as:  EVISTA Take 1 tablet (60 mg total) by mouth daily.   Vitamin D3 5000 units Caps Take 5,000 Units by mouth daily.       Allergies: No Known Allergies  Family History: Family History  Problem Relation Age of Onset  . Stroke Other   . Heart disease Mother   . Alzheimer's disease Father   . Breast cancer Neg Hx   . Bladder Cancer Neg Hx   . Kidney cancer Neg Hx     Social History:  reports that she quit smoking about 17 years ago. Her smoking use included Cigarettes. She has a 5.00 pack-year smoking history. She has never used smokeless tobacco. She reports that she does not drink alcohol or use drugs.  ROS: UROLOGY Frequent Urination?: No Hard to postpone urination?: No Burning/pain with urination?: No Get up at night to urinate?: No Leakage of urine?: No Urine stream starts and stops?: No Trouble starting stream?: No Do you have to strain to urinate?: No Blood in urine?: No Urinary tract infection?: No Sexually transmitted disease?: No Injury to kidneys or bladder?: No Painful intercourse?: No Weak stream?: No Currently pregnant?: No Vaginal bleeding?: No Last menstrual period?: n  Gastrointestinal Nausea?: No Vomiting?: No Indigestion/heartburn?: No Diarrhea?: No Constipation?: No  Constitutional Fever: No Night sweats?: No Weight loss?: No Fatigue?: No  Skin Skin rash/lesions?: No Itching?: No  Eyes Blurred vision?: No Double vision?: No  Ears/Nose/Throat Sore throat?: No Sinus problems?: No  Hematologic/Lymphatic Swollen glands?: No Easy bruising?: Yes  Cardiovascular Leg swelling?: No Chest pain?: No  Respiratory Cough?: Yes Shortness of breath?: No  Endocrine Excessive thirst?: No  Musculoskeletal Back pain?: No Joint pain?: No  Neurological Headaches?: Yes Dizziness?: No  Psychologic Depression?:  No Anxiety?: No  Physical Exam: BP (!) 160/79   Pulse 87   Ht 5\' 4"  (1.626 m)   Wt 162 lb (73.5 kg)   BMI 27.81 kg/m   Constitutional:  Alert and oriented, No acute distress. HEENT: [place] AT, moist mucus membranes.  Trachea midline, no masses. Cardiovascular: No clubbing, cyanosis, or edema. Respiratory: Normal respiratory effort, no increased work of breathing. GI: Abdomen is soft, nontender, nondistended, no abdominal masses GU: No CVA tenderness.  Skin: No rashes, bruises or suspicious lesions. Neurologic: Grossly intact, no focal deficits, moving all 4 extremities. Psychiatric: Normal mood and affect.  Laboratory Data: Lab Results  Component Value Date   WBC 7.0 04/10/2016   HGB 10.9 (L) 04/02/2016   HCT 35.3 04/10/2016   MCV 87 04/10/2016   PLT 524 (H) 04/10/2016    Lab Results  Component Value Date   CREATININE 0.84 04/10/2016    Lab Results  Component Value Date   HGBA1C 5.1 04/02/2016    Imaging: CLINICAL DATA:  Acute cystitis. Patient had  emergency surgery for left ureteral pelvic junction stone in September of 2016 with stent removed two weeks ago.  EXAM: RENAL / URINARY TRACT ULTRASOUND COMPLETE  COMPARISON:  None.  FINDINGS: Right Kidney:  Length: 10.2 cm. Echogenicity within normal limits. No hydronephrosis visualized. There is a 0.8 cm cyst in the lateral lower pole right kidney.  Left Kidney:  Length: 10.3 cm. Echogenicity within normal limits. No mass or hydronephrosis visualized.  Bladder:  The bladder evaluation is limited due to underdistention.  IMPRESSION: No acute abnormality. Bladder evaluation is limited due to under distention.   Electronically Signed   By: Abelardo Diesel M.D.   On: 06/10/2016 14:01  Renal ultrasound personally reviewed today and with the patient  Assessment & Plan:    1. Left ureteral stone S/p ureteroscopy, LL, stent Stent removed No residual symptoms or hydronephrosis Stone analysis  reviewed today At risk for stones given history of gastric bypass We discussed general stone prevention techniques including drinking plenty water with goal of producing 2.5 L urine daily, increased citric acid intake, avoidance of high oxalate containing foods, and decreased salt intake.  Information about dietary recommendations given today.  Advised to hold Ca supplements given lack of history of ostoporisis All questions answered  Follow up prn  Hollice Espy, MD  [facility] [address] [place], [place] [address] [phone]

## 2016-06-13 ENCOUNTER — Other Ambulatory Visit: Payer: Medicare Other

## 2016-06-13 ENCOUNTER — Ambulatory Visit: Payer: Medicare Other

## 2016-06-13 ENCOUNTER — Encounter
Admission: RE | Admit: 2016-06-13 | Discharge: 2016-06-13 | Disposition: A | Discharge status: Home / Self Care | Payer: BLUE CROSS/BLUE SHIELD | Source: Ambulatory Visit | Attending: Surgery | Admitting: Surgery

## 2016-06-13 NOTE — Patient Instructions (Signed)
  Your procedure is scheduled on: 06-20-16 Report to Same Day Surgery 2nd floor medical mall [facility] Entrance-take elevator on left to 2nd floor.  Check in with surgery information desk.) To find out your arrival time please call [phone] between 1PM - 3PM on 06-19-16  Remember: Instructions that are not followed completely may result in serious medical risk, up to and including death, or upon the discretion of your surgeon and anesthesiologist your surgery may need to be rescheduled.    _x___ 1. Do not eat food or drink liquids after midnight. No gum chewing or hard candies.     __x__ 2. No Alcohol for 24 hours before or after surgery.   __x__3. No Smoking for 24 prior to surgery.   ____  4. Bring all medications with you on the day of surgery if instructed.    __x__ 5. Notify your doctor if there is any change in your medical condition     (cold, fever, infections).     Do not wear jewelry, make-up, hairpins, clips or nail polish.  Do not wear lotions, powders, or perfumes. You may wear deodorant.  Do not shave 48 hours prior to surgery. Men may shave face and neck.  Do not bring valuables to the hospital.    [facility] is not responsible for any belongings or valuables.               Contacts, dentures or bridgework may not be worn into surgery.  Leave your suitcase in the car. After surgery it may be brought to your room.  For patients admitted to the hospital, discharge time is determined by your treatment team.   Patients discharged the day of surgery will not be allowed to drive home.  You will need someone to drive you home and stay with you the night of your procedure.    Please read over the following fact sheets that you were given:   [facility] Preparing for Surgery and or MRSA Information   ____ Take these medicines the morning of surgery with A SIP OF WATER:    1. NONE  2.  3.  4.  5.  6.  ____Fleets enema or Magnesium Citrate as directed.   ____  Use CHG Soap or sage wipes as directed on instruction sheet   ____ Use inhalers on the day of surgery and bring to hospital day of surgery  ____ Stop metformin 2 days prior to surgery    ____ Take 1/2 of usual insulin dose the night before surgery and none on the morning of  surgery.   ____ Stop Aspirin, Coumadin, Pllavix ,Eliquis, Effient, or Pradaxa  x__ Stop Anti-inflammatories such as Advil, Aleve, Ibuprofen, Motrin, Naproxen,          Naprosyn, Goodies powders or aspirin products-STOP IBUPROFEN NOW-Ok to take Tylenol.   _X___ Stop supplements until after surgery-STOP FISH OIL, GLUCOSAMINE-CHONDROITIN, AND PROBIOTIC NOW  ____ Bring C-Pap to the hospital.

## 2016-06-20 ENCOUNTER — Ambulatory Visit: Payer: BLUE CROSS/BLUE SHIELD | Admitting: Anesthesiology

## 2016-06-20 ENCOUNTER — Ambulatory Visit
Admission: RE | Admit: 2016-06-20 | Discharge: 2016-06-20 | Disposition: A | Discharge status: Home / Self Care | Payer: BLUE CROSS/BLUE SHIELD | Source: Ambulatory Visit | Attending: Surgery | Admitting: Surgery

## 2016-06-20 ENCOUNTER — Encounter: Payer: Self-pay | Admitting: Anesthesiology

## 2016-06-20 ENCOUNTER — Encounter
Admission: RE | Disposition: A | Discharge status: Home / Self Care | Payer: Self-pay | Source: Ambulatory Visit | Attending: Surgery

## 2016-06-20 DIAGNOSIS — K439 Ventral hernia without obstruction or gangrene: Secondary | ICD-10-CM | POA: Insufficient documentation

## 2016-06-20 HISTORY — PX: VENTRAL HERNIA REPAIR: SHX424

## 2016-06-20 SURGERY — REPAIR, HERNIA, VENTRAL
Anesthesia: General | Wound class: Clean

## 2016-06-20 MED ORDER — DEXAMETHASONE SODIUM PHOSPHATE 10 MG/ML IJ SOLN
INTRAMUSCULAR | Status: DC | PRN
  Administered 2016-06-20: 5 mg via INTRAVENOUS

## 2016-06-20 MED ORDER — FENTANYL CITRATE (PF) 100 MCG/2ML IJ SOLN
INTRAMUSCULAR | Status: AC
  Filled 2016-06-20: qty 2

## 2016-06-20 MED ORDER — HYDROCODONE-ACETAMINOPHEN 5-325 MG PO TABS: 1.0000 | ORAL_TABLET | ORAL | Status: DC | PRN

## 2016-06-20 MED ORDER — ONDANSETRON HCL 4 MG/2ML IJ SOLN
INTRAMUSCULAR | Status: DC | PRN
Start: 2016-06-20 — End: 2016-06-20
  Administered 2016-06-20: 4 mg via INTRAVENOUS

## 2016-06-20 MED ORDER — PHENYLEPHRINE HCL 10 MG/ML IJ SOLN
INTRAMUSCULAR | Status: DC | PRN
  Administered 2016-06-20: 100 ug via INTRAVENOUS

## 2016-06-20 MED ORDER — FENTANYL CITRATE (PF) 100 MCG/2ML IJ SOLN
INTRAMUSCULAR | Status: DC | PRN
  Administered 2016-06-20 (×2): 50 ug via INTRAVENOUS

## 2016-06-20 MED ORDER — BUPIVACAINE HCL (PF) 0.25 % IJ SOLN
INTRAMUSCULAR | Status: DC | PRN
  Administered 2016-06-20: 13 mL

## 2016-06-20 MED ORDER — FAMOTIDINE 20 MG PO TABS
ORAL_TABLET | ORAL | Status: DC
Start: 2016-06-20 — End: 2016-06-20
  Filled 2016-06-20: qty 1

## 2016-06-20 MED ORDER — LACTATED RINGERS IV SOLN
INTRAVENOUS | Status: DC
  Administered 2016-06-20 (×2): via INTRAVENOUS

## 2016-06-20 MED ORDER — LIDOCAINE HCL (CARDIAC) 20 MG/ML IV SOLN
INTRAVENOUS | Status: DC | PRN
  Administered 2016-06-20: 100 mg via INTRAVENOUS

## 2016-06-20 MED ORDER — HYDROCODONE-ACETAMINOPHEN 5-325 MG PO TABS: 1.0000 | ORAL_TABLET | ORAL | 0 refills | Status: DC | PRN

## 2016-06-20 MED ORDER — EPHEDRINE SULFATE 50 MG/ML IJ SOLN
INTRAMUSCULAR | Status: DC | PRN
  Administered 2016-06-20: 10 mg via INTRAVENOUS

## 2016-06-20 MED ORDER — OXYCODONE HCL 5 MG PO TABS
ORAL_TABLET | ORAL | Status: AC
  Administered 2016-06-20: 5 mg via ORAL
  Filled 2016-06-20: qty 1

## 2016-06-20 MED ORDER — OXYCODONE HCL 5 MG/5ML PO SOLN: 5.0000 mg | Freq: Once | ORAL | Status: AC | PRN

## 2016-06-20 MED ORDER — SUGAMMADEX SODIUM 200 MG/2ML IV SOLN: INTRAVENOUS | Status: DC | PRN

## 2016-06-20 MED ORDER — BUPIVACAINE-EPINEPHRINE (PF) 0.25% -1:200000 IJ SOLN
INTRAMUSCULAR | Status: AC
  Filled 2016-06-20: qty 30

## 2016-06-20 MED ORDER — ROCURONIUM BROMIDE 100 MG/10ML IV SOLN
INTRAVENOUS | Status: DC | PRN
  Administered 2016-06-20: 10 mg via INTRAVENOUS
  Administered 2016-06-20: 20 mg via INTRAVENOUS

## 2016-06-20 MED ORDER — CEFAZOLIN SODIUM-DEXTROSE 2-4 GM/100ML-% IV SOLN
2.0000 g | Freq: Once | INTRAVENOUS | Status: AC
  Administered 2016-06-20: 2 g via INTRAVENOUS

## 2016-06-20 MED ORDER — SUGAMMADEX SODIUM 200 MG/2ML IV SOLN
INTRAVENOUS | Status: DC | PRN
  Administered 2016-06-20: 200 mg via INTRAVENOUS

## 2016-06-20 MED ORDER — FAMOTIDINE 20 MG PO TABS
20.0000 mg | ORAL_TABLET | Freq: Once | ORAL | Status: AC
  Administered 2016-06-20: 20 mg via ORAL

## 2016-06-20 MED ORDER — CEFAZOLIN SODIUM-DEXTROSE 2-4 GM/100ML-% IV SOLN
INTRAVENOUS | Status: AC
  Filled 2016-06-20: qty 100

## 2016-06-20 MED ORDER — FENTANYL CITRATE (PF) 100 MCG/2ML IJ SOLN
25.0000 ug | INTRAMUSCULAR | Status: DC | PRN
  Administered 2016-06-20 (×2): 25 ug via INTRAVENOUS

## 2016-06-20 MED ORDER — PROPOFOL 10 MG/ML IV BOLUS
INTRAVENOUS | Status: DC | PRN
  Administered 2016-06-20: 140 mg via INTRAVENOUS
  Administered 2016-06-20: 40 mg via INTRAVENOUS

## 2016-06-20 MED ORDER — OXYCODONE HCL 5 MG PO TABS
5.0000 mg | ORAL_TABLET | Freq: Once | ORAL | Status: AC | PRN
  Administered 2016-06-20: 5 mg via ORAL

## 2016-06-20 SURGICAL SUPPLY — 23 items
CANISTER SUCT 1200ML W/VALVE (MISCELLANEOUS) ×2 IMPLANT
CHLORAPREP W/TINT 26ML (MISCELLANEOUS) ×2 IMPLANT
DERMABOND ADVANCED (GAUZE/BANDAGES/DRESSINGS) ×1
DERMABOND ADVANCED .7 DNX12 (GAUZE/BANDAGES/DRESSINGS) ×1 IMPLANT
DRAPE LAPAROTOMY 100X77 ABD (DRAPES) ×2 IMPLANT
ELECT REM PT RETURN 9FT ADLT (ELECTROSURGICAL) ×2
ELECTRODE REM PT RTRN 9FT ADLT (ELECTROSURGICAL) ×1 IMPLANT
GAUZE SPONGE 4X4 12PLY STRL (GAUZE/BANDAGES/DRESSINGS) IMPLANT
GLOVE BIO SURGEON STRL SZ7.5 (GLOVE) ×2 IMPLANT
GOWN STRL REUS W/ TWL LRG LVL3 (GOWN DISPOSABLE) ×3 IMPLANT
GOWN STRL REUS W/TWL LRG LVL3 (GOWN DISPOSABLE) ×3
KIT RM TURNOVER STRD PROC AR (KITS) ×2 IMPLANT
LABEL OR SOLS (LABEL) ×2 IMPLANT
NEEDLE HYPO 25X1 1.5 SAFETY (NEEDLE) ×2 IMPLANT
NS IRRIG 500ML POUR BTL (IV SOLUTION) ×2 IMPLANT
PACK BASIN MINOR ARMC (MISCELLANEOUS) ×2 IMPLANT
STAPLER SKIN PROX 35W (STAPLE) IMPLANT
SUT CHROMIC 3 0 SH 27 (SUTURE) ×2 IMPLANT
SUT MNCRL 4-0 (SUTURE) ×1
SUT MNCRL 4-0 27XMFL (SUTURE) ×1
SUT SURGILON 0 30 BLK (SUTURE) ×4 IMPLANT
SUTURE MNCRL 4-0 27XMF (SUTURE) ×1 IMPLANT
SYRINGE 10CC LL (SYRINGE) ×2 IMPLANT

## 2016-06-20 NOTE — Anesthesia Preprocedure Evaluation (Signed)
Anesthesia Evaluation  Patient identified by MRN, date of birth, ID band Patient awake    Reviewed: Allergy & Precautions, H&P , NPO status , Patient's Chart, lab work & pertinent test results  History of Anesthesia Complications Negative for: history of anesthetic complications  Airway Mallampati: III  TM Distance: <3 FB Neck ROM: full    Dental no notable dental hx. (+) Poor Dentition, Chipped, Caps   Pulmonary neg shortness of breath, COPD, former smoker,    Pulmonary exam normal breath sounds clear to auscultation       Cardiovascular Exercise Tolerance: Good hypertension, (-) angina(-) DOE Normal cardiovascular exam+ dysrhythmias  Rhythm:regular Rate:Normal     Neuro/Psych PSYCHIATRIC DISORDERS Anxiety Depression negative neurological ROS     GI/Hepatic negative GI ROS, Neg liver ROS, neg GERD  ,  Endo/Other  negative endocrine ROS  Renal/GU Renal disease     Musculoskeletal   Abdominal   Peds  Hematology negative hematology ROS (+)   Anesthesia Other Findings Past Medical History: No date: Depression No date: Hyperlipidemia     Comment: Mixed No date: Hypertension     Comment: Unspecified No date: Kidney stone No date: LBBB (left bundle branch block) No date: Obesity  Past Surgical History: No date: ANKLE FRACTURE SURGERY No date: BREAST BIOPSY Right     Comment: neg bx 07/04/2013: BREAST SURGERY Right     Comment: Stereotactic biopsy for asymmetric density and              calcifications: Flat epithelial atypia,               intraductal papilloma, fragmented. 09/06/13: BREAST SURGERY Right     Comment: Right breast wide excision, ADH only No date: CESAREAN SECTION No date: CHOLECYSTECTOMY 2013: COLONOSCOPY 03/31/2016: CYSTOSCOPY W/ RETROGRADES N/A     Comment: Procedure: CYSTOSCOPY WITH RETROGRADE               PYELOGRAM;  Surgeon: Cleon Gustin, MD;                Location: [facility] ORS;   Service: Urology;                Laterality: N/A; 04/28/2016: CYSTOSCOPY W/ URETERAL STENT PLACEMENT Left     Comment: Procedure: CYSTOSCOPY WITH STENT REPLACEMENT;               Surgeon: Hollice Espy, MD;  Location: [facility]               ORS;  Service: Urology;  Laterality: Left; 03/31/2016: CYSTOSCOPY WITH STENT PLACEMENT Left     Comment: Procedure: CYSTOSCOPY WITH STENT PLACEMENT;                Surgeon: Cleon Gustin, MD;  Location:               [facility] ORS;  Service: Urology;  Laterality: Left; 2015: [place] OF UTERUS 2012: GASTRIC BYPASS 07/2015: SMALL INTESTINE SURGERY     Comment: Accenditial cut-[facility] 04/28/2016: URETEROSCOPY WITH HOLMIUM LASER LITHOTRIPSY Left     Comment: Procedure: URETEROSCOPY WITH HOLMIUM LASER               LITHOTRIPSY;  Surgeon: Hollice Espy, MD;                Location: [facility] ORS;  Service: Urology;                Laterality: Left; 01/25/2016: VENTRAL HERNIA REPAIR N/A  Comment: Procedure: HERNIA REPAIR VENTRAL ADULT;                Surgeon: Leonie Green, MD;  Location:               [facility] ORS;  Service: General;  Laterality: N/A;  BMI    Body Mass Index:  28.32 kg/m      Reproductive/Obstetrics negative OB ROS                             Anesthesia Physical Anesthesia Plan  ASA: III  Anesthesia Plan: General ETT   Post-op Pain Management:    Induction:   Airway Management Planned:   Additional Equipment:   Intra-op Plan:   Post-operative Plan:   Informed Consent: I have reviewed the patients History and Physical, chart, labs and discussed the procedure including the risks, benefits and alternatives for the proposed anesthesia with the patient or authorized representative who has indicated his/her understanding and acceptance.   Dental Advisory Given  Plan Discussed with: Anesthesiologist, CRNA and Surgeon  Anesthesia Plan Comments:         Anesthesia Quick Evaluation

## 2016-06-20 NOTE — H&P (Signed)
  She reports no change in condition since office exam.  Labs noted  Ventral hernia identified and marked YES  Discussed plan for surgery.

## 2016-06-20 NOTE — Anesthesia Procedure Notes (Signed)
Procedure Name: Intubation Date/Time: 06/20/2016 11:12 AM Performed by: Justus Memory Pre-anesthesia Checklist: Patient identified, Suction available, Emergency Drugs available and Patient being monitored Patient Re-evaluated:Patient Re-evaluated prior to inductionOxygen Delivery Method: Circle system utilized Preoxygenation: Pre-oxygenation with 100% oxygen Intubation Type: IV induction Ventilation: Mask ventilation without difficulty Laryngoscope Size: Mac and 3 Grade View: Grade I Tube type: Oral Tube size: 7.0 mm Number of attempts: 1 Airway Equipment and Method: Stylet and Patient positioned with wedge pillow Placement Confirmation: ETT inserted through vocal cords under direct vision,  positive ETCO2 and breath sounds checked- equal and bilateral Secured at: 21 cm Tube secured with: Tape Dental Injury: Teeth and Oropharynx as per pre-operative assessment

## 2016-06-20 NOTE — Transfer of Care (Signed)
Immediate Anesthesia Transfer of Care Note  Patient: Julia Sandoval  Procedure(s) Performed: Procedure(s): HERNIA REPAIR VENTRAL ADULT (N/A)  Patient Location: PACU  Anesthesia Type:General  Level of Consciousness: awake, alert  and oriented  Airway & Oxygen Therapy: Patient Spontanous Breathing and Patient connected to face mask oxygen  Post-op Assessment: Report given to RN and Post -op Vital signs reviewed and stable  Post vital signs: Reviewed and stable  Last Vitals:  Vitals:   06/20/16 0947 06/20/16 1233  BP: (!) 154/73 134/64  Pulse: 76 71  Resp: 16 20  Temp: 36.4 C 36.3 C    Last Pain:  Vitals:   06/20/16 0947  TempSrc: Oral         Complications: No apparent anesthesia complications

## 2016-06-20 NOTE — Discharge Instructions (Signed)
Take Tylenol or Norco if needed for pain. ° °Should not drive or do anything dangerous when taking Norco. ° °May shower. ° °Avoid straining and heavy lifting. ° ° ° ° ° °AMBULATORY SURGERY  °DISCHARGE INSTRUCTIONS ° ° °1) The drugs that you were given will stay in your system until tomorrow so for the next 24 hours you should not: ° °A) Drive an automobile °B) Make any legal decisions °C) Drink any alcoholic beverage ° ° °2) You may resume regular meals tomorrow.  Today it is better to start with liquids and gradually work up to solid foods. ° °You may eat anything you prefer, but it is better to start with liquids, then soup and crackers, and gradually work up to solid foods. ° ° °3) Please notify your doctor immediately if you have any unusual bleeding, trouble breathing, redness and pain at the surgery site, drainage, fever, or pain not relieved by medication. ° ° ° °4) Additional Instructions: ° ° ° ° ° ° ° °Please contact your physician with any problems or Same Day Surgery at [phone], Monday through Friday 6 am to 4 pm, or [facility] Main number at [phone]. °

## 2016-06-20 NOTE — Op Note (Signed)
OPERATIVE REPORT  PREOPERATIVE  DIAGNOSIS: . Ventral hernia  POSTOPERATIVE DIAGNOSIS: . Ventral hernia  PROCEDURE: . Ventral hernia repair  ANESTHESIA:  General  SURGEON: Rochel Brome  MD   INDICATIONS: . She has had recent bulging in the left upper quadrant of the abdomen. A ventral hernia was demonstrated on physical exam that appeared to contain bowel. Surgery was recommended for definitive treatment.  The patient was placed on the operating table in the supine position under general anesthesia. The abdomen was prepared with ChloraPrep and draped in a sterile manner.  In the epigastrium a transversely oriented incision was made left of midline approximately 5 cm in length carried down through subcutaneous tissues. One traversing vein was divided between 3-0 chromic  ligatures. A ventral hernia sac was encountered and was dissected free from surrounding tissues and was  6 cm in length. It was closely adherent to the fascia and the hernia sac was excised  and was not submitted for pathology. There was visible bowel immediately beneath the defect. The fascial ring defect was approximate 4 cm in dimension. It appeared that there was diastasis recti as there was no visible rectus muscle at this site. Mesh was not used due to the close proximity of the bowel and dense adherence of the peritoneum to the fascia.. The repair was carried out with a transversely oriented suture line of interrupted 0 Surgilon figure-of-eight sutures. The deep fascia and subcutaneous tissues were infiltrated with quarter percent Sensorcaine with epinephrine. Subcutaneous tissues were approximated with 3-0 chromic. The skin was closed with running 4-0 Monocryl subcuticular suture and Dermabond.  The patient tolerated surgery satisfactorily and was then prepared for transfer to the recovery room  [facility].D.

## 2016-06-20 NOTE — OR Nursing (Signed)
Dr. Smith into see pt.

## 2016-06-21 NOTE — Anesthesia Postprocedure Evaluation (Signed)
Anesthesia Post Note  Patient: Julia Sandoval  Procedure(s) Performed: Procedure(s) (LRB): HERNIA REPAIR VENTRAL ADULT (N/A)  Patient location during evaluation: PACU Anesthesia Type: General Level of consciousness: awake and alert Pain management: pain level controlled Vital Signs Assessment: post-procedure vital signs reviewed and stable Respiratory status: spontaneous breathing, nonlabored ventilation, respiratory function stable and patient connected to nasal cannula oxygen Cardiovascular status: blood pressure returned to baseline and stable Postop Assessment: no signs of nausea or vomiting Anesthetic complications: no    Last Vitals:  Vitals:   06/20/16 1335 06/20/16 1421  BP: (!) 143/73 (!) 141/54  Pulse: 65   Resp: 18   Temp: 36.4 C     Last Pain:  Vitals:   06/20/16 1335  TempSrc:   PainSc: 3                  Precious Haws Piscitello

## 2016-06-24 ENCOUNTER — Ambulatory Visit
Admission: RE | Admit: 2016-06-24 | Discharge: 2016-06-24 | Disposition: A | Discharge status: Home / Self Care | Payer: BLUE CROSS/BLUE SHIELD | Source: Ambulatory Visit | Attending: General Surgery | Admitting: General Surgery

## 2016-06-24 ENCOUNTER — Other Ambulatory Visit: Payer: Self-pay | Admitting: General Surgery

## 2016-06-24 DIAGNOSIS — N6091 Unspecified benign mammary dysplasia of right breast: Secondary | ICD-10-CM

## 2016-09-16 ENCOUNTER — Other Ambulatory Visit: Payer: Self-pay | Admitting: General Surgery

## 2016-09-16 ENCOUNTER — Encounter: Payer: Self-pay | Admitting: *Deleted

## 2016-09-16 DIAGNOSIS — N6091 Unspecified benign mammary dysplasia of right breast: Secondary | ICD-10-CM

## 2016-09-23 ENCOUNTER — Ambulatory Visit: Payer: Self-pay | Admitting: General Surgery

## 2016-11-06 ENCOUNTER — Encounter: Payer: Self-pay | Admitting: *Deleted

## 2016-12-10 ENCOUNTER — Encounter: Payer: Self-pay | Admitting: *Deleted

## 2016-12-17 ENCOUNTER — Ambulatory Visit: Payer: Self-pay | Admitting: General Surgery

## 2017-01-13 ENCOUNTER — Other Ambulatory Visit: Payer: Self-pay | Admitting: Surgery

## 2017-01-13 DIAGNOSIS — K432 Incisional hernia without obstruction or gangrene: Secondary | ICD-10-CM

## 2017-01-19 ENCOUNTER — Other Ambulatory Visit: Payer: Medicare Other

## 2017-01-20 ENCOUNTER — Ambulatory Visit
Admission: RE | Admit: 2017-01-20 | Discharge: 2017-01-20 | Disposition: A | Discharge status: Home / Self Care | Payer: BLUE CROSS/BLUE SHIELD | Source: Ambulatory Visit | Attending: Surgery | Admitting: Surgery

## 2017-01-20 DIAGNOSIS — K432 Incisional hernia without obstruction or gangrene: Secondary | ICD-10-CM

## 2017-01-20 MED ORDER — IOPAMIDOL (ISOVUE-300) INJECTION 61%
100.0000 mL | Freq: Once | INTRAVENOUS | Status: AC | PRN
  Administered 2017-01-20: 100 mL via INTRAVENOUS

## 2017-02-06 ENCOUNTER — Other Ambulatory Visit (HOSPITAL_COMMUNITY): Payer: Self-pay | Admitting: Psychiatry

## 2017-02-18 ENCOUNTER — Other Ambulatory Visit (HOSPITAL_COMMUNITY): Payer: Self-pay | Admitting: Psychiatry

## 2017-03-11 ENCOUNTER — Other Ambulatory Visit: Payer: Self-pay | Admitting: Family Medicine

## 2017-03-11 DIAGNOSIS — Z1231 Encounter for screening mammogram for malignant neoplasm of breast: Secondary | ICD-10-CM

## 2017-06-26 ENCOUNTER — Ambulatory Visit
Admission: RE | Admit: 2017-06-26 | Discharge: 2017-06-26 | Disposition: A | Discharge status: Home / Self Care | Payer: BLUE CROSS/BLUE SHIELD | Source: Ambulatory Visit | Attending: Family Medicine | Admitting: Family Medicine

## 2017-06-26 DIAGNOSIS — Z1231 Encounter for screening mammogram for malignant neoplasm of breast: Secondary | ICD-10-CM | POA: Diagnosis not present

## 2017-08-04 ENCOUNTER — Emergency Department
Admission: EM | Admit: 2017-08-04 | Discharge: 2017-08-04 | Disposition: A | Discharge status: Home / Self Care | Payer: BLUE CROSS/BLUE SHIELD | Attending: Emergency Medicine | Admitting: Emergency Medicine

## 2017-08-04 ENCOUNTER — Encounter: Payer: Self-pay | Admitting: Emergency Medicine

## 2017-08-04 ENCOUNTER — Other Ambulatory Visit: Payer: Self-pay

## 2017-08-04 DIAGNOSIS — Y999 Unspecified external cause status: Secondary | ICD-10-CM | POA: Diagnosis not present

## 2017-08-04 DIAGNOSIS — S61512A Laceration without foreign body of left wrist, initial encounter: Secondary | ICD-10-CM | POA: Diagnosis not present

## 2017-08-04 DIAGNOSIS — Y939 Activity, unspecified: Secondary | ICD-10-CM | POA: Insufficient documentation

## 2017-08-04 DIAGNOSIS — Z23 Encounter for immunization: Secondary | ICD-10-CM | POA: Insufficient documentation

## 2017-08-04 DIAGNOSIS — Z79899 Other long term (current) drug therapy: Secondary | ICD-10-CM | POA: Diagnosis not present

## 2017-08-04 DIAGNOSIS — Z87891 Personal history of nicotine dependence: Secondary | ICD-10-CM | POA: Insufficient documentation

## 2017-08-04 DIAGNOSIS — I10 Essential (primary) hypertension: Secondary | ICD-10-CM | POA: Diagnosis not present

## 2017-08-04 DIAGNOSIS — Y929 Unspecified place or not applicable: Secondary | ICD-10-CM | POA: Insufficient documentation

## 2017-08-04 DIAGNOSIS — W268XXA Contact with other sharp object(s), not elsewhere classified, initial encounter: Secondary | ICD-10-CM | POA: Insufficient documentation

## 2017-08-04 MED ORDER — TETANUS-DIPHTH-ACELL PERTUSSIS 5-2.5-18.5 LF-MCG/0.5 IM SUSP
0.5000 mL | Freq: Once | INTRAMUSCULAR | Status: AC
  Administered 2017-08-04: 0.5 mL via INTRAMUSCULAR
  Filled 2017-08-04: qty 0.5

## 2017-08-04 NOTE — ED Notes (Signed)
Patient presents to ED for laceration to left wrist sustained from a piece of wood she was moving. Linear laceration, bleeding controlled at this time. Patient states her last tetanus shot was within 10 years. Motor, sensory intact.

## 2017-08-04 NOTE — ED Provider Notes (Signed)
[facility] Emergency Department Provider Note  ____________________________________________  Time seen: Approximately 8:59 PM  I have reviewed the triage vital signs and the nursing notes.   HISTORY  Chief Complaint Laceration    HPI Julia Sandoval is a 72 y.o. female who presents the emergency department complaining of laceration to the dorsal left wrist.  Patient reports that she was pulling her hands back towards her body, struck the corner of a piece of wood.  Patient reports that she sustained a laceration/skin tear to the dorsal left wrist.  She was able to control the bleeding with direct pressure.  She covered it with a Band-Aid.  Due to the size and location she presented to the emergency department for further evaluation.  Patient reports that her tetanus shot was approximately 10-12 years ago.  She is unsure of exact time for her tetanus.  She denies any loss of range of motion to the hand or wrist.  No other injury or complaint.  No medications for this complaint prior to arrival.  Past Medical History:  Diagnosis Date  . Depression   . Hyperlipidemia    Mixed  . Hypertension    Unspecified  . Kidney stone   . LBBB (left bundle branch block)   . Obesity     Patient Active Problem List   Diagnosis Date Noted  . Left ureteral stone 05/12/2016  . Acute pyelonephritis 03/31/2016  . Acute biliary pancreatitis 07/05/2015  . Anxiety 04/06/2015  . Depression 04/06/2015  . Vitamin D deficiency 04/06/2015  . 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 Flat epithelial atypia of right breast 08/10/2013  . HYPERLIPIDEMIA-MIXED 12/12/2008  . OVERWEIGHT/OBESITY 12/12/2008  . Essential hypertension 12/12/2008  . Left bundle branch block (LBBB) 12/12/2008  . DYSPNEA 12/12/2008  . Overweight 12/12/2008    Past Surgical History:  Procedure Laterality Date  . ANKLE FRACTURE SURGERY    . BREAST  BIOPSY Right    neg bx  . BREAST SURGERY Right 07/04/2013   Stereotactic biopsy for asymmetric density and calcifications: Flat epithelial atypia, intraductal papilloma, fragmented.  Marland Kitchen BREAST SURGERY Right 09/06/13   Right breast wide excision, ADH only  . CESAREAN SECTION    . CHOLECYSTECTOMY    . COLONOSCOPY  2013  . CYSTOSCOPY W/ RETROGRADES N/A 03/31/2016   Procedure: CYSTOSCOPY WITH RETROGRADE PYELOGRAM;  Surgeon: Cleon Gustin, MD;  Location: [facility] ORS;  Service: Urology;  Laterality: N/A;  . CYSTOSCOPY W/ URETERAL STENT PLACEMENT Left 04/28/2016   Procedure: CYSTOSCOPY WITH STENT REPLACEMENT;  Surgeon: Hollice Espy, MD;  Location: [facility] ORS;  Service: Urology;  Laterality: Left;  . CYSTOSCOPY WITH STENT PLACEMENT Left 03/31/2016   Procedure: CYSTOSCOPY WITH STENT PLACEMENT;  Surgeon: Cleon Gustin, MD;  Location: [facility] ORS;  Service: Urology;  Laterality: Left;  . DILATION AND CURETTAGE OF UTERUS  2015  . GASTRIC BYPASS  2012  . SMALL INTESTINE SURGERY  07/2015   Accenditial cut-[facility]  . URETEROSCOPY WITH HOLMIUM LASER LITHOTRIPSY Left 04/28/2016   Procedure: URETEROSCOPY WITH HOLMIUM LASER LITHOTRIPSY;  Surgeon: Hollice Espy, MD;  Location: [facility] ORS;  Service: Urology;  Laterality: Left;  Marland Kitchen VENTRAL HERNIA REPAIR N/A 01/25/2016   Procedure: HERNIA REPAIR VENTRAL ADULT;  Surgeon: Leonie Green, MD;  Location: [facility] ORS;  Service: General;  Laterality: N/A;  . VENTRAL HERNIA REPAIR N/A 06/20/2016   Procedure: HERNIA REPAIR VENTRAL ADULT;  Surgeon: Leonie Green, MD;  Location: [facility]  ORS;  Service: General;  Laterality: N/A;    Prior to Admission medications   Medication Sig Start Date End Date Taking? Authorizing Provider  acetaminophen (TYLENOL) 500 MG tablet Take 1,000 mg by mouth every 6 (six) hours as needed (for pain.).    [provider]  buPROPion (WELLBUTRIN XL) 300 MG 24 hr tablet Take 300 mg by mouth every morning. 01/05/16   [provider]   calcium citrate (CALCITRATE - DOSED IN MG ELEMENTAL CALCIUM) 950 MG tablet Take 100 mg of elemental calcium by mouth daily.     [provider]  Cholecalciferol (VITAMIN D3) 5000 units CAPS Take 5,000 Units by mouth daily.     [provider]  glucosamine-chondroitin 500-400 MG tablet Take 1 tablet by mouth 2 (two) times daily.    [provider]  HYDROcodone-acetaminophen (NORCO) 5-325 MG tablet Take 1-2 tablets by mouth every 4 (four) hours as needed for moderate pain. 06/20/16   Leonie Green, MD  ibuprofen (ADVIL,MOTRIN) 600 MG tablet Take 600 mg by mouth 2 (two) times daily as needed (for pain.). Pain   12/21/15   [provider]  Multiple Vitamin (MULTIVITAMIN) tablet Take 1 tablet by mouth every morning.     [provider]  Omega-3 Fatty Acids (FISH OIL PO) Take 1 capsule by mouth 2 (two) times daily.    [provider]  Probiotic Product (PROBIOTIC-10 PO) Take 1 capsule by mouth 2 (two) times daily.     [provider]  QUEtiapine (SEROQUEL) 100 MG tablet Take 100 mg by mouth at bedtime.    [provider]  raloxifene (EVISTA) 60 MG tablet Take 1 tablet (60 mg total) by mouth daily. Patient taking differently: Take 60 mg by mouth daily at 2 PM.  11/06/15   Byrnett, Forest Gleason, MD  raloxifene (EVISTA) 60 MG tablet TAKE 1 TABLET DAILY 09/16/16   Byrnett, Forest Gleason, MD    Allergies Patient has no known allergies.  Family History  Problem Relation Age of Onset  . Stroke Other   . Heart disease Mother   . Alzheimer's disease Father   . Breast cancer Neg Hx   . Bladder Cancer Neg Hx   . Kidney cancer Neg Hx     Social History Social History   Tobacco Use  . Smoking status: Former Smoker    Packs/day: 0.50    Years: 10.00    Pack years: 5.00    Types: Cigarettes    Last attempt to quit: 07/07/1998    Years since quitting: 19.0  . Smokeless tobacco: Never Used  Substance Use Topics  . Alcohol use: No   . Drug use: No     Review of Systems  Constitutional: No fever/chills Eyes: No visual changes.  Cardiovascular: no chest pain. Respiratory: no cough. No SOB. Gastrointestinal: No abdominal pain.  No nausea, no vomiting.   Musculoskeletal: Negative for musculoskeletal pain. Skin: Positive for laceration/skin tear to the left dorsal wrist Neurological: Negative for headaches, focal weakness or numbness. 10-point ROS otherwise negative.  ____________________________________________   PHYSICAL EXAM:  VITAL SIGNS: ED Triage Vitals [08/04/17 1945]  Enc Vitals Group     BP (!) 168/84     Pulse Rate 78     Resp 20     Temp 98 F (36.7 C)     Temp Source Oral     SpO2 100 %     Weight 170 lb (77.1 kg)     Height 5\' 4"  (  1.626 m)     Head Circumference      Peak Flow      Pain Score      Pain Loc      Pain Edu?      Excl. in [place]?      Constitutional: Alert and oriented. Well appearing and in no acute distress. Eyes: Conjunctivae are normal. PERRL. EOMI. Head: Atraumatic. Neck: No stridor.    Cardiovascular: Normal rate, regular rhythm. Normal S1 and S2.  Good peripheral circulation. Respiratory: Normal respiratory effort without tachypnea or retractions. Lungs CTAB. Good air entry to the bases with no decreased or absent breath sounds. Musculoskeletal: Full range of motion to all extremities. No gross deformities appreciated. Neurologic:  Normal speech and language. No gross focal neurologic deficits are appreciated.  Skin:  Skin is warm, dry and intact. No rash noted.  6 cm laceration noted to the left dorsal wrist.  Edges are smooth in nature.  The medial aspect of the laceration is fork shape.  No foreign body.  Patient has no underlying tenderness to palpation.  No palpable abnormality.  Radial pulse intact.  Sensation intact all 5 digits.  Capillary refill intact all 5 digits. Psychiatric: Mood and affect are normal. Speech and behavior are normal. Patient exhibits  appropriate insight and judgement.   ____________________________________________   LABS (all labs ordered are listed, but only abnormal results are displayed)  Labs Reviewed - No data to display ____________________________________________  EKG   ____________________________________________  RADIOLOGY   No results found.  ____________________________________________    PROCEDURES  Procedure(s) performed:    Marland KitchenMarland KitchenLaceration Repair Date/Time: 08/04/2017 9:24 PM Performed by: Darletta Moll, PA-C Authorized by: Darletta Moll, PA-C   Consent:    Consent obtained:  Verbal   Consent given by:  Patient   Risks discussed:  Pain Anesthesia (see MAR for exact dosages):    Anesthesia method:  None Laceration details:    Location:  Shoulder/arm   Shoulder/arm location:  L lower arm   Length (cm):  6 Repair type:    Repair type:  Simple Exploration:    Hemostasis achieved with:  Direct pressure   Wound exploration: wound explored through full range of motion and entire depth of wound probed and visualized     Wound extent: no foreign bodies/material noted, no muscle damage noted, no nerve damage noted, no tendon damage noted and no underlying fracture noted     Contaminated: no   Treatment:    Area cleansed with:  Shur-Clens   Amount of cleaning:  Standard Skin repair:    Repair method:  Tissue adhesive Approximation:    Approximation:  Close Post-procedure details:    Dressing:  Splint for protection   Patient tolerance of procedure:  Tolerated well, no immediate complications Comments:     Patient opted for Dermabond.  Edges are well approximated, sealed using Dermabond.  After letting Dermabond set, patient is given explicit to reduce range of motion to the wrist as this overlies the wrist.      Medications  Tdap (BOOSTRIX) injection 0.5 mL (not administered)     ____________________________________________   INITIAL IMPRESSION /  ASSESSMENT AND PLAN / ED COURSE  Pertinent labs & imaging results that were available during my care of the patient were reviewed by me and considered in my medical decision making (see chart for details).  Review of the [place] CSRS was performed in accordance of the [place] prior to dispensing any controlled drugs.  Patient's diagnosis is consistent with laceration to the left wrist.  This is close as described above.  No complications.  Patient is given wound care instructions.  No prescriptions at this time.  Tetanus shot was updated.  Patient will follow primary care as needed..  Patient is given ED precautions to return to the ED for any worsening or new symptoms.     ____________________________________________  FINAL CLINICAL IMPRESSION(S) / ED DIAGNOSES  Final diagnoses:  Wrist laceration, left, initial encounter      NEW MEDICATIONS STARTED DURING THIS VISIT:  ED Discharge Orders    None          This chart was dictated using voice recognition software/Dragon. Despite best efforts to proofread, errors can occur which can change the meaning. Any change was purely unintentional.    Darletta Moll, PA-C 08/04/17 [id number]    Hinda Kehr, MD 08/05/17 [phone]

## 2017-08-04 NOTE — ED Triage Notes (Signed)
Patient ambulatory to triage with steady gait, without difficulty or distress noted ; pt reports cutting arm on wooden corner about 4pm; approx 2"lac noted to top of left wrist with no active bleeindg

## 2017-08-04 NOTE — ED Notes (Signed)
Pt discharged to home.  Family member driving.  Discharge instructions reviewed.  Verbalized understanding.  No questions or concerns at this time.  Teach back verified.  Pt in NAD.  No items left in ED.   

## 2018-04-09 IMAGING — CR DG C-ARM 61-120 MIN
1 series · 1 of 1 positions shown · non-contrast
Comparison: None

CLINICAL DATA: Left kidney stent.

EXAM:
DG C-ARM 61-120 MIN; ABDOMEN - 1 VIEW

[m1]
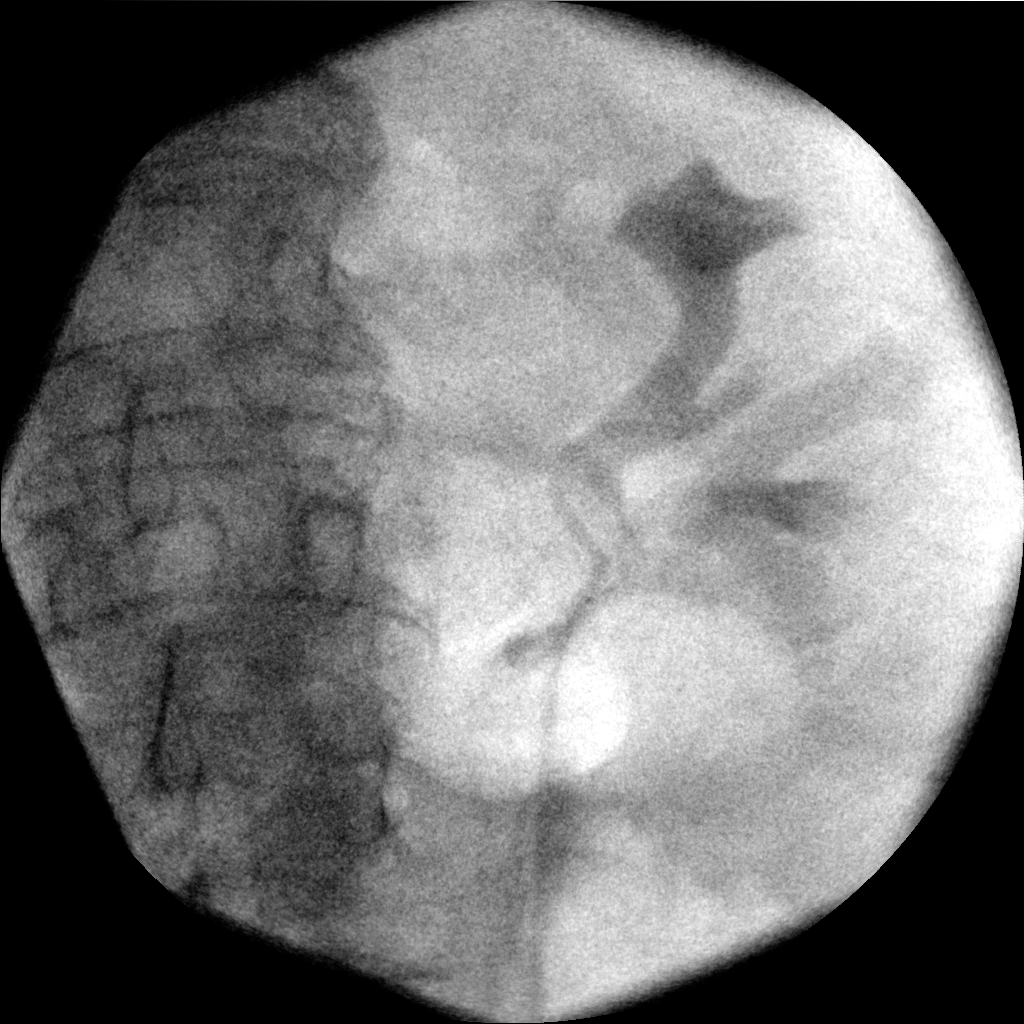

[1 of 1 positions shown; findings below may reference images not displayed]

FINDINGS: Single fluoroscopic image shows contrast within the collecting
system, presumably related to retrograde pyelogram. Stent is not
clearly seen on this single image.
IMPRESSION: Intraoperative fluoroscopic image, as detailed above.

## 2018-06-07 ENCOUNTER — Other Ambulatory Visit: Payer: Self-pay | Admitting: Family Medicine

## 2018-06-07 DIAGNOSIS — Z1231 Encounter for screening mammogram for malignant neoplasm of breast: Secondary | ICD-10-CM

## 2018-06-28 ENCOUNTER — Ambulatory Visit
Admission: RE | Admit: 2018-06-28 | Discharge: 2018-06-28 | Disposition: A | Discharge status: Home / Self Care | Payer: BLUE CROSS/BLUE SHIELD | Source: Ambulatory Visit | Attending: Family Medicine | Admitting: Family Medicine

## 2018-06-28 DIAGNOSIS — Z1231 Encounter for screening mammogram for malignant neoplasm of breast: Secondary | ICD-10-CM | POA: Diagnosis present

## 2018-07-05 ENCOUNTER — Other Ambulatory Visit: Payer: Self-pay | Admitting: Family Medicine

## 2018-07-05 DIAGNOSIS — R928 Other abnormal and inconclusive findings on diagnostic imaging of breast: Secondary | ICD-10-CM

## 2018-07-05 DIAGNOSIS — N6489 Other specified disorders of breast: Secondary | ICD-10-CM

## 2018-07-14 ENCOUNTER — Ambulatory Visit
Admission: RE | Admit: 2018-07-14 | Discharge: 2018-07-14 | Disposition: A | Discharge status: Home / Self Care | Payer: BLUE CROSS/BLUE SHIELD | Source: Ambulatory Visit | Attending: Family Medicine | Admitting: Family Medicine

## 2018-07-14 DIAGNOSIS — R928 Other abnormal and inconclusive findings on diagnostic imaging of breast: Secondary | ICD-10-CM | POA: Diagnosis not present

## 2018-07-14 DIAGNOSIS — N6489 Other specified disorders of breast: Secondary | ICD-10-CM

## 2018-12-15 ENCOUNTER — Other Ambulatory Visit: Payer: Self-pay | Admitting: Surgery

## 2018-12-15 DIAGNOSIS — K432 Incisional hernia without obstruction or gangrene: Secondary | ICD-10-CM

## 2019-04-15 ENCOUNTER — Other Ambulatory Visit: Payer: Self-pay | Admitting: Family Medicine

## 2019-04-15 DIAGNOSIS — Z1231 Encounter for screening mammogram for malignant neoplasm of breast: Secondary | ICD-10-CM

## 2021-01-24 ENCOUNTER — Other Ambulatory Visit: Payer: Self-pay | Admitting: Family Medicine

## 2021-01-24 DIAGNOSIS — Z1231 Encounter for screening mammogram for malignant neoplasm of breast: Secondary | ICD-10-CM

## 2021-02-04 ENCOUNTER — Ambulatory Visit
Admission: RE | Admit: 2021-02-04 | Discharge: 2021-02-04 | Disposition: A | Discharge status: Home / Self Care | Payer: BLUE CROSS/BLUE SHIELD | Source: Ambulatory Visit | Attending: Family Medicine | Admitting: Family Medicine

## 2021-02-04 ENCOUNTER — Other Ambulatory Visit: Payer: Self-pay

## 2021-02-04 DIAGNOSIS — Z1231 Encounter for screening mammogram for malignant neoplasm of breast: Secondary | ICD-10-CM | POA: Diagnosis present

## 2021-02-11 ENCOUNTER — Other Ambulatory Visit: Payer: Self-pay | Admitting: Family Medicine

## 2021-02-11 DIAGNOSIS — R928 Other abnormal and inconclusive findings on diagnostic imaging of breast: Secondary | ICD-10-CM

## 2021-02-11 DIAGNOSIS — N6310 Unspecified lump in the right breast, unspecified quadrant: Secondary | ICD-10-CM

## 2021-03-01 ENCOUNTER — Ambulatory Visit: Admission: RE | Admit: 2021-03-01 | Payer: BLUE CROSS/BLUE SHIELD | Source: Ambulatory Visit

## 2021-03-01 ENCOUNTER — Other Ambulatory Visit: Payer: BLUE CROSS/BLUE SHIELD

## 2021-03-04 ENCOUNTER — Ambulatory Visit
Admission: RE | Admit: 2021-03-04 | Discharge: 2021-03-04 | Disposition: A | Discharge status: Home / Self Care | Payer: BC Managed Care – PPO | Source: Ambulatory Visit | Attending: Family Medicine | Admitting: Family Medicine

## 2021-03-04 ENCOUNTER — Other Ambulatory Visit: Payer: Self-pay

## 2021-03-04 DIAGNOSIS — N6310 Unspecified lump in the right breast, unspecified quadrant: Secondary | ICD-10-CM

## 2021-03-04 DIAGNOSIS — R928 Other abnormal and inconclusive findings on diagnostic imaging of breast: Secondary | ICD-10-CM | POA: Insufficient documentation

## 2021-03-06 ENCOUNTER — Other Ambulatory Visit: Payer: Self-pay | Admitting: Family Medicine

## 2021-03-06 DIAGNOSIS — N6310 Unspecified lump in the right breast, unspecified quadrant: Secondary | ICD-10-CM

## 2021-03-06 DIAGNOSIS — R928 Other abnormal and inconclusive findings on diagnostic imaging of breast: Secondary | ICD-10-CM

## 2021-05-14 ENCOUNTER — Other Ambulatory Visit: Payer: Self-pay

## 2021-05-14 ENCOUNTER — Ambulatory Visit
Admission: RE | Admit: 2021-05-14 | Discharge: 2021-05-14 | Disposition: A | Discharge status: Home / Self Care | Payer: BC Managed Care – PPO | Source: Ambulatory Visit | Attending: Family Medicine | Admitting: Family Medicine

## 2021-05-14 DIAGNOSIS — N6310 Unspecified lump in the right breast, unspecified quadrant: Secondary | ICD-10-CM | POA: Insufficient documentation

## 2021-05-14 DIAGNOSIS — N6311 Unspecified lump in the right breast, upper outer quadrant: Secondary | ICD-10-CM | POA: Insufficient documentation

## 2021-05-14 DIAGNOSIS — R928 Other abnormal and inconclusive findings on diagnostic imaging of breast: Secondary | ICD-10-CM | POA: Insufficient documentation

## 2021-05-14 HISTORY — PX: BREAST BIOPSY: SHX20

## 2021-05-20 ENCOUNTER — Other Ambulatory Visit: Payer: Self-pay | Admitting: Surgery

## 2021-05-20 ENCOUNTER — Ambulatory Visit: Payer: Self-pay | Admitting: Surgery

## 2021-05-20 DIAGNOSIS — C50411 Malignant neoplasm of upper-outer quadrant of right female breast: Secondary | ICD-10-CM

## 2021-05-20 LAB — SURGICAL PATHOLOGY

## 2021-05-20 NOTE — H&P (Signed)
Subjective:   CC: Malignant neoplasm of upper-outer quadrant of right female breast, unspecified estrogen receptor status (CMS-HCC) [C50.411] HPI:  Julia Sandoval is a 75 y.o. female who was referred by Barnabas Lister, * for evaluation of above. Change was noted on last screening mammogram. Patient does not routinely do self breast exams.  Age of menarche was unsure. Age of menopause was mid 23s.  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 She states she takes raloxifene for osteoporosis only.   Patient is G2P2. Patient did breast feed. Patient denies nipple discharge. Patient reports previous breast biopsy. Patient denies a personal history of breast cancer.   Pt also noted to have positive cologuard.  She states she has had blood in her stools before.  Last colonoscopy over 38yr ago per report.   Past Medical History:  has a past medical history of Anxiety, Arthritis, Benign breast lumps, Depression, and Osteoporosis, post-menopausal.   Past Surgical History:  has a past surgical history that includes Breast Lump (Right, 2015); Gastric bypass open (2013); Tonsillectomy; Cholecystectomy; Cesarean section; Hernia repair (01/25/2016); Hernia repair (06/20/2016); and repair incisional/ventral hernia (N/A, 02/03/2017).   Family History: family history includes Alzheimer's disease in her father; Stroke in her mother.   Social History:  reports that she quit smoking about 32 years ago. Her smoking use included cigarettes. She has never used smokeless tobacco. She reports that she does not drink alcohol and does not use drugs.   Current Medications: has a current medication list which includes the following prescription(s): bupropion, calcium citrate, cholecalciferol, cyanocobalamin, glucosamine/chondroitin/c/mang, ibandronate, modafinil, multivitamin, quetiapine, vortioxetine,  ferrous sulfate, omeprazole, and raloxifene.   Allergies:  Allergies as of 05/20/2021  (No Known Allergies)     ROS:  A 15 point review of systems was performed and was negative except as noted in HPI   Objective:   BP (!) 145/81   Pulse 71   Ht 162.6 cm (_0 )   Wt 87.5 kg (193 lb)   BMI 33.13 kg/m    Constitutional :  No distress, cooperative, alert Lymphatics/Throat:  Supple with no lymphadenopathy Respiratory:  Clear to auscultation bilaterally Cardiovascular:  Regular rate and rhythm Gastrointestinal: Soft, non-tender, non-distended, no organomegaly. Musculoskeletal: Steady gait and movement Skin: Cool and moist,  surgical scars present Psychiatric: Normal affect, non-agitated, not confused Breast: Chaperone present for exam.  Left breast and axilla WNL. Right breast with needle biopsy changes, possibly palpable mass in question in outer quadrant.  No lymphadenopathy within axilla.     LABS:  SURGICAL PATHOLOGY     SURGICAL PATHOLOGY  CASE: [phone] PATIENT: Julia Sandoval  Surgical Pathology Report          Specimen Submitted:  A. Breast , right    Clinical History: History of right breast excisional; biopsy.  indeterminate 581mmass like area. Ribbon-shaped clip placed following  ultrasound guided biopsy of RIGHT breast at 10 o'clock.        DIAGNOSIS:  A. BREAST, RIGHT AT 10:00, 5 CM FROM THE NIPPLE; ULTRASOUND-GUIDED CORE  NEEDLE BIOPSY:  - INVASIVE MAMMARY CARCINOMA, NO SPECIAL TYPE.    Size of invasive carcinoma: 6 mm in this sample  Histologic grade of invasive carcinoma: Grade 2                       Glandular/tubular differentiation score: 3  Nuclear pleomorphism score: 2                       Mitotic rate score: 1                       Total score: 6  Ductal carcinoma in situ: Present  Lymphovascular invasion: Not identified    ER/PR/HER2: Immunohistochemistry will be performed on block A1, with  reflex to [place] for  HER2 2+. The results will be reported in an addendum.    GROSS DESCRIPTION:  A. Labeled: Right breast 10:00 5 cm from nipple  Received: Formalin  Time/date in fixative: Collected and placed in formalin at 4:20 PM on  05/15/2021  Cold ischemic time: Less than 1 minute  Total fixation time: Approximately 22 hours  Core pieces: 5 cores  Size: Range from 1-1.4 cm in length and 0.2 cm in diameter  Description: Received are cores of yellow fibrofatty tissue.  Ink color: Blue  Entirely submitted in cassettes 1-2 with 3 cores in cassette 1 and 2  cores in cassette 2.    RB 05/15/2021    Final Diagnosis performed by Allena Napoleon, MD.   Electronically signed  05/16/2021 10:42:07AM  The electronic signature indicates that the named Attending Pathologist  has evaluated the specimen  Technical component performed at [facility], [address] [place],  [place] [address] Lab: [id number] Dir: Rush Farmer, MD, MMM   Professional component performed at [facility], [facility], [place], [place], [place] [address] Lab: [phone]  Dir: Kathi Simpers, MD        RADS: CLINICAL DATA:  Recall for possible mass in the right breast.   EXAM: DIGITAL DIAGNOSTIC UNILATERAL RIGHT MAMMOGRAM WITH TOMOSYNTHESIS AND CAD; ULTRASOUND RIGHT BREAST LIMITED   TECHNIQUE: Right digital diagnostic mammography and breast tomosynthesis was performed. The images were evaluated with computer-aided detection.; Targeted ultrasound examination of the right breast was performed   COMPARISON:  Previous exam(s).   ACR Breast Density Category b: There are scattered areas of fibroglandular density.   FINDINGS: The previously noted possible mass in the right breast persists on additional views as a 0.5 cm irregular mass with associated calcifications and architectural distortion at the right breast 10 o'clock position middle depth.   Targeted ultrasound of the right breast at the 10 o'clock  position 5 cm from the nipple demonstrates 0.3 x 0.5 x 0.4 cm irregular hypoechoic mass with indistinct and angular margins, corresponding to the mammographic finding. Internal vascularity is noted.   Targeted ultrasound of the right axilla demonstrates lymph nodes with normal morphology.   IMPRESSION: 1. Suspicious 0.5 cm mass at the right breast 10 o'clock position. Recommend ultrasound-guided biopsy. 2. No right axillary lymphadenopathy.   RECOMMENDATION: Ultrasound-guided biopsy of the right breast. We will obtain an order from the clinical provider and schedule the patient for the procedure at her earliest convenience.   I have discussed the findings and recommendations with the patient. If applicable, a reminder letter will be sent to the patient regarding the next appointment.   BI-RADS CATEGORY  4: Suspicious.     Electronically Signed   By: Ileana Roup M.D.   On: 03/04/2021 16:25   ADDENDUM: PATHOLOGY revealed: A. BREAST, RIGHT AT 10:00, 5 CM FROM THE NIPPLE; ULTRASOUND-GUIDED CORE NEEDLE BIOPSY: - INVASIVE MAMMARY CARCINOMA, NO SPECIAL TYPE. Size of invasive carcinoma: 6 mm in this sample. Histologic grade of invasive carcinoma: Grade 2. Ductal  carcinoma in situ: Present. Lymphovascular invasion: Not identified.   Pathology results are CONCORDANT with imaging findings, per Dr. Valentino Saxon.     Assessment:   Malignant neoplasm of upper-outer quadrant of right female breast, unspecified estrogen receptor status (CMS-HCC) [C50.411] .  Discussed pathology in detail and recommend proceeding with lumpectomy, SLNB. Likely post op radiation afterwards.  Will need to discuss with onc, rad-onc final plans for adjuvant therapy, since she is currently on raloxifene.  Unsure why this was started due to conflicting report from patient.     Positive cologuard   Plan:   1. Malignant neoplasm of upper-outer quadrant of right female breast, unspecified estrogen  receptor status (CMS-HCC) [C50.411]  Discussed the risk of surgery including recurrence, chronic pain, post-op infxn, poor/delayed wound healing, poor cosmesis, seroma, hematoma formation, and possible re-operation to address said risks. The risks of general anesthetic, if used, includes MI, CVA, sudden death or even reaction to anesthetic medications also discussed.  Typical post-op recovery time and possbility of activity restrictions were also discussed.  Alternatives include continued observation.  Benefits include possible symptom relief, pathologic evaluation, and/or curative excision.    The patient verbalized understanding and all questions were answered to the patient's satisfaction.   Positive cologuard-- our office will schedule diagnostic colonoscopy as soon as surgery above is completed.

## 2021-05-20 NOTE — H&P (View-Only) (Signed)
Subjective:   CC: Malignant neoplasm of upper-outer quadrant of right female breast, unspecified estrogen receptor status (CMS-HCC) [C50.411] HPI:  Julia Sandoval is a 75 y.o. female who was referred by Barnabas Lister, * for evaluation of above. Change was noted on last screening mammogram. Patient does not routinely do self breast exams.  Age of menarche was unsure. Age of menopause was mid 23s.  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 She states she takes raloxifene for osteoporosis only.   Patient is G2P2. Patient did breast feed. Patient denies nipple discharge. Patient reports previous breast biopsy. Patient denies a personal history of breast cancer.   Pt also noted to have positive cologuard.  She states she has had blood in her stools before.  Last colonoscopy over 38yr ago per report.   Past Medical History:  has a past medical history of Anxiety, Arthritis, Benign breast lumps, Depression, and Osteoporosis, post-menopausal.   Past Surgical History:  has a past surgical history that includes Breast Lump (Right, 2015); Gastric bypass open (2013); Tonsillectomy; Cholecystectomy; Cesarean section; Hernia repair (01/25/2016); Hernia repair (06/20/2016); and repair incisional/ventral hernia (N/A, 02/03/2017).   Family History: family history includes Alzheimer's disease in her father; Stroke in her mother.   Social History:  reports that she quit smoking about 32 years ago. Her smoking use included cigarettes. She has never used smokeless tobacco. She reports that she does not drink alcohol and does not use drugs.   Current Medications: has a current medication list which includes the following prescription(s): bupropion, calcium citrate, cholecalciferol, cyanocobalamin, glucosamine/chondroitin/c/mang, ibandronate, modafinil, multivitamin, quetiapine, vortioxetine,  ferrous sulfate, omeprazole, and raloxifene.   Allergies:  Allergies as of 05/20/2021  (No Known Allergies)     ROS:  A 15 point review of systems was performed and was negative except as noted in HPI   Objective:   BP (!) 145/81   Pulse 71   Ht 162.6 cm (_0 )   Wt 87.5 kg (193 lb)   BMI 33.13 kg/m    Constitutional :  No distress, cooperative, alert Lymphatics/Throat:  Supple with no lymphadenopathy Respiratory:  Clear to auscultation bilaterally Cardiovascular:  Regular rate and rhythm Gastrointestinal: Soft, non-tender, non-distended, no organomegaly. Musculoskeletal: Steady gait and movement Skin: Cool and moist,  surgical scars present Psychiatric: Normal affect, non-agitated, not confused Breast: Chaperone present for exam.  Left breast and axilla WNL. Right breast with needle biopsy changes, possibly palpable mass in question in outer quadrant.  No lymphadenopathy within axilla.     LABS:  SURGICAL PATHOLOGY     SURGICAL PATHOLOGY  CASE: [id number] PATIENT: Julia Sandoval  Surgical Pathology Report          Specimen Submitted:  A. Breast , right    Clinical History: History of right breast excisional; biopsy.  indeterminate 581mmass like area. Ribbon-shaped clip placed following  ultrasound guided biopsy of RIGHT breast at 10 o'clock.        DIAGNOSIS:  A. BREAST, RIGHT AT 10:00, 5 CM FROM THE NIPPLE; ULTRASOUND-GUIDED CORE  NEEDLE BIOPSY:  - INVASIVE MAMMARY CARCINOMA, NO SPECIAL TYPE.    Size of invasive carcinoma: 6 mm in this sample  Histologic grade of invasive carcinoma: Grade 2                       Glandular/tubular differentiation score: 3  Nuclear pleomorphism score: 2                       Mitotic rate score: 1                       Total score: 6  Ductal carcinoma in situ: Present  Lymphovascular invasion: Not identified    ER/PR/HER2: Immunohistochemistry will be performed on block A1, with  reflex to [place] for  HER2 2+. The results will be reported in an addendum.    GROSS DESCRIPTION:  A. Labeled: Right breast 10:00 5 cm from nipple  Received: Formalin  Time/date in fixative: Collected and placed in formalin at 4:20 PM on  05/15/2021  Cold ischemic time: Less than 1 minute  Total fixation time: Approximately 22 hours  Core pieces: 5 cores  Size: Range from 1-1.4 cm in length and 0.2 cm in diameter  Description: Received are cores of yellow fibrofatty tissue.  Ink color: Blue  Entirely submitted in cassettes 1-2 with 3 cores in cassette 1 and 2  cores in cassette 2.    RB 05/15/2021    Final Diagnosis performed by Allena Napoleon, MD.   Electronically signed  05/16/2021 10:42:07AM  The electronic signature indicates that the named Attending Pathologist  has evaluated the specimen  Technical component performed at [facility], [address] [place],  [place] [address] Lab: [id number] Dir: Rush Farmer, MD, MMM   Professional component performed at [facility], [facility], [place], [place], [place] [address] Lab: [phone]  Dir: Kathi Simpers, MD        RADS: CLINICAL DATA:  Recall for possible mass in the right breast.   EXAM: DIGITAL DIAGNOSTIC UNILATERAL RIGHT MAMMOGRAM WITH TOMOSYNTHESIS AND CAD; ULTRASOUND RIGHT BREAST LIMITED   TECHNIQUE: Right digital diagnostic mammography and breast tomosynthesis was performed. The images were evaluated with computer-aided detection.; Targeted ultrasound examination of the right breast was performed   COMPARISON:  Previous exam(s).   ACR Breast Density Category b: There are scattered areas of fibroglandular density.   FINDINGS: The previously noted possible mass in the right breast persists on additional views as a 0.5 cm irregular mass with associated calcifications and architectural distortion at the right breast 10 o'clock position middle depth.   Targeted ultrasound of the right breast at the 10 o'clock  position 5 cm from the nipple demonstrates 0.3 x 0.5 x 0.4 cm irregular hypoechoic mass with indistinct and angular margins, corresponding to the mammographic finding. Internal vascularity is noted.   Targeted ultrasound of the right axilla demonstrates lymph nodes with normal morphology.   IMPRESSION: 1. Suspicious 0.5 cm mass at the right breast 10 o'clock position. Recommend ultrasound-guided biopsy. 2. No right axillary lymphadenopathy.   RECOMMENDATION: Ultrasound-guided biopsy of the right breast. We will obtain an order from the clinical provider and schedule the patient for the procedure at her earliest convenience.   I have discussed the findings and recommendations with the patient. If applicable, a reminder letter will be sent to the patient regarding the next appointment.   BI-RADS CATEGORY  4: Suspicious.     Electronically Signed   By: Ileana Roup M.D.   On: 03/04/2021 16:25   ADDENDUM: PATHOLOGY revealed: A. BREAST, RIGHT AT 10:00, 5 CM FROM THE NIPPLE; ULTRASOUND-GUIDED CORE NEEDLE BIOPSY: - INVASIVE MAMMARY CARCINOMA, NO SPECIAL TYPE. Size of invasive carcinoma: 6 mm in this sample. Histologic grade of invasive carcinoma: Grade 2. Ductal  carcinoma in situ: Present. Lymphovascular invasion: Not identified.   Pathology results are CONCORDANT with imaging findings, per Dr. Valentino Saxon.     Assessment:   Malignant neoplasm of upper-outer quadrant of right female breast, unspecified estrogen receptor status (CMS-HCC) [C50.411] .  Discussed pathology in detail and recommend proceeding with lumpectomy, SLNB. Likely post op radiation afterwards.  Will need to discuss with onc, rad-onc final plans for adjuvant therapy, since she is currently on raloxifene.  Unsure why this was started due to conflicting report from patient.     Positive cologuard   Plan:   1. Malignant neoplasm of upper-outer quadrant of right female breast, unspecified estrogen  receptor status (CMS-HCC) [C50.411]  Discussed the risk of surgery including recurrence, chronic pain, post-op infxn, poor/delayed wound healing, poor cosmesis, seroma, hematoma formation, and possible re-operation to address said risks. The risks of general anesthetic, if used, includes MI, CVA, sudden death or even reaction to anesthetic medications also discussed.  Typical post-op recovery time and possbility of activity restrictions were also discussed.  Alternatives include continued observation.  Benefits include possible symptom relief, pathologic evaluation, and/or curative excision.    The patient verbalized understanding and all questions were answered to the patient's satisfaction.   Positive cologuard-- our office will schedule diagnostic colonoscopy as soon as surgery above is completed.

## 2021-06-04 ENCOUNTER — Encounter
Admission: RE | Admit: 2021-06-04 | Discharge: 2021-06-04 | Disposition: A | Discharge status: Home / Self Care | Payer: BC Managed Care – PPO | Source: Ambulatory Visit | Attending: Surgery | Admitting: Surgery

## 2021-06-04 VITALS — Ht 64.0 in | Wt 185.0 lb

## 2021-06-04 DIAGNOSIS — Z01812 Encounter for preprocedural laboratory examination: Secondary | ICD-10-CM

## 2021-06-04 DIAGNOSIS — I10 Essential (primary) hypertension: Secondary | ICD-10-CM

## 2021-06-04 NOTE — Patient Instructions (Addendum)
Your procedure is scheduled on: Thursday, December 8 Report to the Registration Desk on the 1st floor of the [facility]. To find out your arrival time, please call [phone] between 1PM - 3PM on: Wednesday, December 7  REMEMBER: Instructions that are not followed completely may result in serious medical risk, up to and including death; or upon the discretion of your surgeon and anesthesiologist your surgery may need to be rescheduled.  Do not eat food after midnight the night before surgery.  No gum chewing, lozengers or hard candies.  You may however, drink CLEAR liquids up to 2 hours before you are scheduled to arrive for your surgery. Do not drink anything within 2 hours of your scheduled arrival time.  Clear liquids include: - water  - apple juice without pulp - gatorade (not RED, PURPLE, OR BLUE) - black coffee or tea (Do NOT add milk or creamers to the coffee or tea) Do NOT drink anything that is not on this list.  TAKE THESE MEDICATIONS THE MORNING OF SURGERY WITH A SIP OF WATER:  Bupropion  Trintellix  One week prior to surgery: starting December 1 Stop Anti-inflammatories (NSAIDS) such as Advil, Aleve, Ibuprofen, Motrin, Naproxen, Naprosyn and Aspirin based products such as Excedrin, Goodys Powder, BC Powder. Stop ANY OVER THE COUNTER supplements until after surgery. You may however, continue to take Tylenol if needed for pain up until the day of surgery.  No Alcohol for 24 hours before or after surgery.  No Smoking including e-cigarettes for 24 hours prior to surgery.  No chewable tobacco products for at least 6 hours prior to surgery.  No nicotine patches on the day of surgery.  Do not use any "recreational" drugs for at least a week prior to your surgery.  Please be advised that the combination of cocaine and anesthesia may have negative outcomes, up to and including death. If you test positive for cocaine, your surgery will be cancelled.  On the morning of  surgery brush your teeth with toothpaste and water, you may rinse your mouth with mouthwash if you wish. Do not swallow any toothpaste or mouthwash.  Use CHG Soap as directed on instruction sheet.  Do not wear jewelry, make-up, hairpins, clips or nail polish.  Do not wear lotions, powders, or perfumes.   Do not shave body from the neck down 48 hours prior to surgery just in case you cut yourself which could leave a site for infection.  Also, freshly shaved skin may become irritated if using the CHG soap.  Contact lenses, hearing aids and dentures may not be worn into surgery.  Do not bring valuables to the hospital. [facility] is not responsible for any missing/lost belongings or valuables.   Notify your doctor if there is any change in your medical condition (cold, fever, infection).  Wear comfortable clothing (specific to your surgery type) to the hospital.  After surgery, you can help prevent lung complications by doing breathing exercises.  Take deep breaths and cough every 1-2 hours. Your doctor may order a device called an Incentive Spirometer to help you take deep breaths.  If you are being discharged the day of surgery, you will not be allowed to drive home. You will need a responsible adult (18 years or older) to drive you home and stay with you that night.   If you are taking public transportation, you will need to have a responsible adult (18 years or older) with you. Please confirm with your physician that it  is acceptable to use public transportation.   Please call the [facility] at [phone] if you have any questions about these instructions.  Surgery Visitation Policy:  Patients undergoing a surgery or procedure may have one family member or support person with them as long as that person is not COVID-19 positive or experiencing its symptoms.  That person may remain in the waiting area during the procedure and may rotate out with other  people.

## 2021-06-05 ENCOUNTER — Inpatient Hospital Stay: Payer: BC Managed Care – PPO | Attending: Internal Medicine | Admitting: Internal Medicine

## 2021-06-05 ENCOUNTER — Ambulatory Visit
Admission: RE | Admit: 2021-06-05 | Discharge: 2021-06-05 | Disposition: A | Discharge status: Home / Self Care | Payer: BC Managed Care – PPO | Source: Ambulatory Visit | Attending: Surgery | Admitting: Surgery

## 2021-06-05 ENCOUNTER — Encounter: Payer: Self-pay | Admitting: Internal Medicine

## 2021-06-05 ENCOUNTER — Other Ambulatory Visit: Payer: Self-pay

## 2021-06-05 ENCOUNTER — Inpatient Hospital Stay: Payer: BC Managed Care – PPO

## 2021-06-05 DIAGNOSIS — I10 Essential (primary) hypertension: Secondary | ICD-10-CM | POA: Diagnosis not present

## 2021-06-05 DIAGNOSIS — Z79899 Other long term (current) drug therapy: Secondary | ICD-10-CM | POA: Diagnosis not present

## 2021-06-05 DIAGNOSIS — M810 Age-related osteoporosis without current pathological fracture: Secondary | ICD-10-CM | POA: Diagnosis not present

## 2021-06-05 DIAGNOSIS — E669 Obesity, unspecified: Secondary | ICD-10-CM | POA: Diagnosis not present

## 2021-06-05 DIAGNOSIS — Z170 Estrogen receptor positive status [ER+]: Secondary | ICD-10-CM | POA: Diagnosis not present

## 2021-06-05 DIAGNOSIS — C50411 Malignant neoplasm of upper-outer quadrant of right female breast: Secondary | ICD-10-CM

## 2021-06-05 DIAGNOSIS — E785 Hyperlipidemia, unspecified: Secondary | ICD-10-CM | POA: Insufficient documentation

## 2021-06-05 NOTE — Progress Notes (Signed)
[facility] [place] NOTE  Patient Care Team: [name], MD as PCP - General (Family Medicine) [name], MD (Family Medicine) Bary Castilla, Forest Gleason, MD (General Surgery)  CHIEF COMPLAINTS/PURPOSE OF CONSULTATION: Breast cancer  #  Oncology History Overview Note  Targeted ultrasound of the right breast at the 10 o'clock position 5 cm from the nipple demonstrates 0.3 x 0.5 x 0.4 cm irregular hypoechoic mass with indistinct and angular margins, corresponding to the mammographic finding. Internal vascularity is noted.   Targeted ultrasound of the right axilla demonstrates lymph nodes with normal morphology.   IMPRESSION: 1. Suspicious 0.5 cm mass at the right breast 10 o'clock position. Recommend ultrasound-guided biopsy. 2. No right axillary lymphadenopathy.  DIAGNOSIS:  A. BREAST, RIGHT AT 10:00, 5 CM FROM THE NIPPLE; ULTRASOUND-GUIDED CORE  NEEDLE BIOPSY:  - INVASIVE MAMMARY CARCINOMA, NO SPECIAL TYPE.   Size of invasive carcinoma: 6 mm in this sample  Histologic grade of invasive carcinoma: Grade 2                       Glandular/tubular differentiation score: 3                       Nuclear pleomorphism score: 2                       Mitotic rate score: 1                       Total score: 6  Ductal carcinoma in situ: Present  Lymphovascular invasion: Not identified   ER/PR/HER2: Immunohistochemistry will be performed on block A1, with  reflex to [place] for HER2 2+. The results will be reported in an addendum.   GROSS DESCRIPTION:  A. Labeled: Right breast 10:00 5 cm from nipple  Received: Formalin  Time/date in fixative: Collected and placed in formalin at 4:20 PM on  05/15/2021  Cold ischemic time: Less than 1 minute  Total fixation time: Approximately 22 hours  Core pieces: 5 cores  Size: Range from 1-1.4 cm in length and 0.2 cm in diameter  Description: Received are cores of yellow fibrofatty tissue.  Ink color: Blue  Entirely submitted  in cassettes 1-2 with 3 cores in cassette 1 and 2  cores in cassette 2.   RB 05/15/2021   Final Diagnosis performed by Allena Napoleon, MD.   Electronically signed  05/16/2021 10:42:07AM  The electronic signature indicates that the named Attending Pathologist  has evaluated the specimen  Technical component performed at [facility], [address], [place],  [place] [address] Lab: [phone] Dir: Rush Farmer, MD, MMM   Professional component performed at [facility], [facility], [place], [place], [place] [address] Lab: [phone]  Dir: Kathi Simpers, MD   ADDENDUM:  CASE SUMMARY: BREAST BIOMARKER TESTS  Estrogen Receptor (ER) Status: POSITIVE          Percentage of cells with nuclear positivity: >90%          Average intensity of staining: Strong   Progesterone Receptor (PgR) Status: POSITIVE          Percentage of cells with nuclear positivity: >90%          Average intensity of staining: Strong   HER2 (by immunohistochemistry): NEGATIVE (Score 1+)   Ki-67: Not performed   Cold Ischemia and Fixation Times: Meet requirements specified in latest  version of the ASCO/CAP guidelines  Testing Performed on Block Number(s): A1   METHODS  Fixative: Formalin  Estrogen Receptor:  FDA cleared ([place]) Primary Antibody:  SP1  Progesterone Receptor: FDA cleared ([place]) Primary Antibody: 1E2  HER2 (by IHC): FDA approved ([place]) Primary Antibody: 4B5 ([facility])  Immunohistochemistry controls worked appropriately. Slides were prepared  by [facility], [place], [place], and interpreted by Allena Napoleon,  MD.  ([id number])    Carcinoma of upper-outer quadrant of right breast in female, estrogen receptor positive ([place])  06/05/2021 Initial Diagnosis   Carcinoma of upper-outer quadrant of right breast in female, estrogen receptor positive ([place])      HISTORY OF PRESENTING ILLNESS: Obese.  Ambulating independently.  Alone. Julia Sandoval 75 y.o.  female female  with no prior history of breast cancer/or malignancies has been referred to [place] for further evaluation recommendations for new diagnosis of breast cancer.  Patient had a right lumpectomy in 2015 with Dr. Lilli Few for cancer at that time.  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  Patient has been on Boniva since August 2022.   Patient states she was found to have an abnormal screening mammogram which led to diagnostic mammogram/ultrasound/followed by biopsy-as summarized above.  Denies any bone pain or joint pains.  Review of Systems  Constitutional:  Negative for chills, diaphoresis, fever, malaise/fatigue and weight loss.  HENT:  Negative for nosebleeds and sore throat.   Eyes:  Negative for double vision.  Respiratory:  Negative for cough, hemoptysis, sputum production, shortness of breath and wheezing.   Cardiovascular:  Negative for chest pain, palpitations, orthopnea and leg swelling.  Gastrointestinal:  Negative for abdominal pain, blood in stool, constipation, diarrhea, heartburn, melena, nausea and vomiting.  Genitourinary:  Negative for dysuria, frequency and urgency.  Musculoskeletal:  Negative for back pain and joint pain.  Skin: Negative.  Negative for itching and rash.  Neurological:  Negative for dizziness, tingling, focal weakness, weakness and headaches.  Endo/Heme/Allergies:  Does not bruise/bleed easily.  Psychiatric/Behavioral:  Negative for depression. The patient is not nervous/anxious and does not have insomnia.     MEDICAL HISTORY:  Past Medical History:  Diagnosis Date   Depression    Hyperlipidemia    Mixed   Hypertension    Unspecified   Kidney stone    LBBB (left bundle branch block)    Obesity     SURGICAL HISTORY: Past Surgical History:  Procedure Laterality Date   ANKLE FRACTURE SURGERY     BREAST BIOPSY Right 05/14/2021   [place] bx, ribbon marker, path pending   BREAST EXCISIONAL BIOPSY Right 2013    neg   BREAST SURGERY Right 07/04/2013   Stereotactic biopsy for asymmetric density and calcifications: Flat epithelial atypia, intraductal papilloma, fragmented.   BREAST SURGERY Right 09/06/2013   Right breast wide excision, ADH only   CESAREAN SECTION     CHOLECYSTECTOMY     COLONOSCOPY  2013   CYSTOSCOPY W/ RETROGRADES N/A 03/31/2016   Procedure: CYSTOSCOPY WITH RETROGRADE PYELOGRAM;  Surgeon: Cleon Gustin, MD;  Location: [facility];  Service: Urology;  Laterality: N/A;   CYSTOSCOPY W/ URETERAL STENT PLACEMENT Left 04/28/2016   Procedure: CYSTOSCOPY WITH STENT REPLACEMENT;  Surgeon: Hollice Espy, MD;  Location: [facility];  Service: Urology;  Laterality: Left;   CYSTOSCOPY WITH STENT PLACEMENT Left 03/31/2016   Procedure: CYSTOSCOPY WITH STENT PLACEMENT;  Surgeon: Cleon Gustin, MD;  Location: [facility];  Service: Urology;  Laterality: Left;   DILATION AND CURETTAGE OF UTERUS  2015  GASTRIC BYPASS  2012   SMALL INTESTINE SURGERY  07/2015   Accenditial cut-[facility]   TONSILLECTOMY     URETEROSCOPY WITH HOLMIUM LASER LITHOTRIPSY Left 04/28/2016   Procedure: URETEROSCOPY WITH HOLMIUM LASER LITHOTRIPSY;  Surgeon: Hollice Espy, MD;  Location: [facility];  Service: Urology;  Laterality: Left;   VENTRAL HERNIA REPAIR N/A 01/25/2016   Procedure: HERNIA REPAIR VENTRAL ADULT;  Surgeon: Leonie Green, MD;  Location: [facility];  Service: General;  Laterality: N/A;   VENTRAL HERNIA REPAIR N/A 06/20/2016   Procedure: HERNIA REPAIR VENTRAL ADULT;  Surgeon: Leonie Green, MD;  Location: [facility];  Service: General;  Laterality: N/A;    SOCIAL HISTORY: Social History   Socioeconomic History   Marital status: Married    Spouse name: Evette Doffing   Number of children: Not on file   Years of education: Not on file   Highest education level: Not on file  Occupational History   Occupation: Full time  Tobacco Use   Smoking status: Former    Packs/day: 0.50    Years: 10.00    Pack  years: 5.00    Types: Cigarettes    Quit date: 07/07/1998    Years since quitting: 22.9   Smokeless tobacco: Never  Vaping Use   Vaping Use: Never used  Substance and Sexual Activity   Alcohol use: No   Drug use: Not Currently    Types: Marijuana    Comment: last used 2021   Sexual activity: Not on file  Other Topics Concern   Not on file  Social History Narrative   Gets regular exercise; lives in [place]; with husband; custody of the three grandkids [son died 18 years]; remote hx of smoking; no alcohol. 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  MEDICATIONS:  Current Outpatient Medications  Medication Sig Dispense Refill   acetaminophen (TYLENOL) 500 MG tablet Take 1,000 mg by mouth every 6 (six) hours as needed (for pain.).     ASHWAGANDHA PO Take 2 capsules by mouth daily.     buPROPion (WELLBUTRIN XL) 300 MG 24 hr tablet Take 300 mg by mouth every morning.     Calcium Carbonate (CALCIUM 500 PO) Take 2 tablets by mouth daily.     Cholecalciferol (VITAMIN D3) 50 MCG (2000 UT) capsule Take 4,000 Units by mouth daily.     Coenzyme Q10 (COQ10) 100 MG CAPS Take 200 mg by mouth daily.     ibandronate (BONIVA) 150 MG tablet Take 150 mg by mouth every 30 (thirty) days.     ibuprofen (ADVIL) 200 MG tablet Take 600 mg by mouth 2 (two) times daily as needed (for pain.). Pain       Misc Natural Products (GLUCOSAMINE CHOND COMPLEX/MSM PO) Take 1 tablet  by mouth in the morning, at noon, and at bedtime.     modafinil (PROVIGIL) 200 MG  tablet Take 200 mg by mouth daily as needed (Alertness).     Multiple Vitamin (MULTIVITAMIN) tablet Take 2 tablets by mouth every morning. Gummies     Omega-3 Fatty Acids (FISH OIL PO) Take 3 each by mouth daily. 450 mg per gummy     Probiotic Product (PROBIOTIC-10 PO) Take 1 capsule by mouth 2 (two) times daily.      QUEtiapine (SEROQUEL) 100 MG tablet Take 100 mg by mouth at bedtime.     TURMERIC CURCUMIN PO Take 2 capsules by mouth daily.     vitamin B-12 (CYANOCOBALAMIN) 1000 MCG tablet Take 2,000 mcg by mouth daily.     vitamin C (ASCORBIC ACID) 250 MG tablet Take 500 mg by mouth daily.     vortioxetine HBr (TRINTELLIX) 20 MG TABS tablet Take 20 mg by mouth daily.     ZINC GLUCONATE PO Take 7.5 mg by mouth daily.     No current facility-administered medications for this visit.      Marland Kitchen  PHYSICAL EXAMINATION: ECOG PERFORMANCE STATUS: 0 - Asymptomatic  Vitals:   06/05/21 1357  BP: 111/82  Pulse: 70  Resp: 16  Temp: (!) 97.2 F (36.2 C)  SpO2: 100%   Filed Weights   06/05/21 1357  Weight: 192 lb 6.4 oz (87.3 kg)    Physical Exam Vitals and nursing note reviewed.  HENT:     Head: Normocephalic and atraumatic.     Mouth/Throat:     Pharynx: Oropharynx is clear.  Eyes:     Extraocular Movements: Extraocular movements intact.     Pupils: Pupils are equal, round, and reactive to light.  Cardiovascular:     Rate and Rhythm: Normal rate and regular rhythm.  Pulmonary:     Comments: Decreased breath sounds bilaterally.  Abdominal:     Palpations: Abdomen is soft.  Musculoskeletal:        General: Normal range of motion.     Cervical back: Normal range of motion.  Skin:    General: Skin is warm.  Neurological:     General: No focal deficit present.     Mental Status: She is alert and oriented to person, place, and time.  Psychiatric:        Behavior: Behavior normal.        Judgment:  Judgment normal.     LABORATORY DATA:  I have reviewed the data as listed Lab Results  Component Value Date   WBC 7.0 04/10/2016   HGB 11.6 04/10/2016   HCT 35.3 04/10/2016   MCV 87 04/10/2016   PLT 524 (H) 04/10/2016   No results for input(s): NA, K, CL, CO2, GLUCOSE, BUN, CREATININE, CALCIUM, GFRNONAA, GFRAA, PROT, ALBUMIN, AST, ALT, ALKPHOS, BILITOT, BILIDIR, IBILI in the last 8760 hours.  RADIOGRAPHIC STUDIES: I have personally reviewed the radiological images as listed and agreed with the findings in the report. MM CLIP PLACEMENT RIGHT  Result Date: 05/14/2021 CLINICAL DATA:  Status post RIGHT breast ultrasound-guided biopsy EXAM: 3D DIAGNOSTIC RIGHT MAMMOGRAM POST ULTRASOUND BIOPSY COMPARISON:  Previous exam(s). FINDINGS: 3D Mammographic images were obtained following ultrasound guided biopsy of mass at 10 o'clock. The RIBBON shaped biopsy marking clip is in expected position at the site of biopsy. IMPRESSION: Appropriate positioning of the RIBBON shaped biopsy marking clip at the site of biopsy in the RIGHT upper outer breast. Final Assessment: Post Procedure Mammograms for Marker Placement Electronically Signed   By: Valentino Saxon M.D.   On: 05/14/2021 16:39  [place] RT BREAST BX  W LOC DEV 1ST LESION IMG BX SPEC US GUIDE  Addendum Date: 05/16/2021   ADDENDUM REPORT: 05/16/2021 15:21 ADDENDUM: PATHOLOGY revealed: A. BREAST, RIGHT AT 10:00, 5 CM FROM THE NIPPLE; ULTRASOUND-GUIDED CORE NEEDLE BIOPSY: - INVASIVE MAMMARY CARCINOMA, NO SPECIAL TYPE. Size of invasive carcinoma: 6 mm in this sample. Histologic grade of invasive carcinoma: Grade 2. Ductal carcinoma in situ: Present. Lymphovascular invasion: Not identified. Pathology results are CONCORDANT with imaging findings, per Dr. Valentino Saxon. I spoke with patient by telephone on 05/16/2021 to assess biopsy site. Patient reported biopsy site doing well with no adverse symptoms, and only slight tenderness at the site. Post biopsy  care instructions were reviewed, questions were answered and my direct phone number was provided. Patient was instructed to call [facility] for any additional questions or concerns related to biopsy site. RECOMMENDATION: Surgical consultation. I spoke with Wendy Poet RN at provider office on 05/16/2021, who stated Dr. [name] discussed biopsy results with patient and arranged surgical consultation appointment for Monday 05/27/2021. Patient is aware of appointment details. Pathology results reported by Electa Sniff RN on 05/16/2021. Electronically Signed   By: Valentino Saxon M.D.   On: 05/16/2021 15:21   Result Date: 05/16/2021 CLINICAL DATA:  Indeterminate RIGHT breast mass EXAM: ULTRASOUND GUIDED RIGHT BREAST CORE NEEDLE BIOPSY COMPARISON:  Previous exam(s). PROCEDURE: I met with the patient and we discussed the procedure of ultrasound-guided biopsy, including benefits and alternatives. We discussed the high likelihood of a successful procedure. We discussed the risks of the procedure, including infection, bleeding, tissue injury, clip migration, and inadequate sampling. Informed written consent was given. The usual time-out protocol was performed immediately prior to the procedure. Lesion quadrant: Upper outer quadrant Using sterile technique and 1% lidocaine and 1% lidocaine with epinephrine as local anesthetic, under direct ultrasound visualization, a 14 gauge spring-loaded device was used to perform biopsy of indeterminate RIGHT breast mass at 10 o'clock using a inferolateral approach. At the conclusion of the procedure a RIBBON tissue marker clip was deployed into the biopsy cavity. Follow up 2 view mammogram was performed and dictated separately. IMPRESSION: Ultrasound guided biopsy of indeterminate RIGHT breast mass. No apparent complications. Electronically Signed: By: Valentino Saxon M.D. On: 05/14/2021 16:38    ASSESSMENT & PLAN:   Carcinoma of upper-outer quadrant of  right breast in female, estrogen receptor positive ([place]) Clinical Stage I- ER/PR > 90%; her-2 NEGATIVE; awaiting lumpectomy surgery[Dr.Sakai] dec 8th, 2022-.  # I had a long discussion with the patient in general regarding the treatment options of breast cancer including-surgery; adjuvant radiation; role of adjuvant systemic therapy including-chemotherapy antihormone therapy.   # Patient awaiting lumpectomy with sentinel lymph node evaluation on December 8.  Discussed the role of radiation postlumpectomy.  # Decision regarding chemotherapy based on final surgical pathology/gene assay. Patient will benefit from antihormone therapy.  I discussed the potential benefits of each option; and also potential downsides in detail.  # Osteoporosis August 2022-BMD- [facility];PCP-  . Interval decrease in bone mineral density  from 06-23-2017- on boniva once a month.  Consider Reclast once a year.  #  Positive Cologard- [awaiting colonoscopy; Dr.Sakai on dec 22; prior colo 8 years ago]  Thank you Dr.Baboff for allowing me to participate in the care of your pleasant patient. Please do not hesitate to contact me with questions or concerns in the interim.  # DISPOSITION: # no labs  # follow up in week of dec 26th- MD; No labs-Dr.B  All questions were answered. The  patient/family knows to call the clinic with any problems, questions or concerns.    Cammie Sickle, MD 06/05/2021 2:50 PM

## 2021-06-05 NOTE — Assessment & Plan Note (Addendum)
Clinical Stage I- ER/PR > 90%; her-2 NEGATIVE; awaiting lumpectomy surgery[Dr.Sakai] dec 8th, 2022-.  # I had a long discussion with the patient in general regarding the treatment options of breast cancer including-surgery; adjuvant radiation; role of adjuvant systemic therapy including-chemotherapy antihormone therapy.   # Patient awaiting lumpectomy with sentinel lymph node evaluation on December 8.  Discussed the role of radiation postlumpectomy.  # Decision regarding chemotherapy based on final surgical pathology/gene assay. Patient will benefit from antihormone therapy.  I discussed the potential benefits of each option; and also potential downsides in detail.  # Osteoporosis August 2022-BMD- [facility];PCP- . Interval decrease in bone mineral density  from 06-23-2017- on boniva once a month.  Consider Reclast once a year.  #  Positive Cologard- [awaiting colonoscopy; Dr.Sakai on dec 22; prior colo 8 years ago]  Thank you Dr.Baboff for allowing me to participate in the care of your pleasant patient. Please do not hesitate to contact me with questions or concerns in the interim.  # DISPOSITION: # no labs  # follow up in week of dec 26th- MD; No labs-Dr.B

## 2021-06-05 NOTE — Progress Notes (Signed)
Patient here for initial visit she has many questions regarding her treatment options.

## 2021-06-06 ENCOUNTER — Encounter
Admission: RE | Admit: 2021-06-06 | Discharge: 2021-06-06 | Disposition: A | Discharge status: Home / Self Care | Payer: BC Managed Care – PPO | Source: Ambulatory Visit | Attending: Surgery | Admitting: Surgery

## 2021-06-06 ENCOUNTER — Encounter: Payer: Self-pay | Admitting: Urgent Care

## 2021-06-06 DIAGNOSIS — I10 Essential (primary) hypertension: Secondary | ICD-10-CM | POA: Insufficient documentation

## 2021-06-06 DIAGNOSIS — Z01818 Encounter for other preprocedural examination: Secondary | ICD-10-CM | POA: Insufficient documentation

## 2021-06-06 DIAGNOSIS — Z01812 Encounter for preprocedural laboratory examination: Secondary | ICD-10-CM

## 2021-06-10 ENCOUNTER — Other Ambulatory Visit: Payer: Self-pay | Admitting: Surgery

## 2021-06-10 DIAGNOSIS — C50411 Malignant neoplasm of upper-outer quadrant of right female breast: Secondary | ICD-10-CM

## 2021-06-13 ENCOUNTER — Ambulatory Visit: Payer: BC Managed Care – PPO | Admitting: Registered Nurse

## 2021-06-13 ENCOUNTER — Ambulatory Visit
Admission: RE | Admit: 2021-06-13 | Discharge: 2021-06-13 | Disposition: A | Discharge status: Home / Self Care | Payer: BC Managed Care – PPO | Attending: Surgery | Admitting: Surgery

## 2021-06-13 ENCOUNTER — Encounter: Payer: Self-pay | Admitting: Surgery

## 2021-06-13 ENCOUNTER — Encounter
Admission: RE | Disposition: A | Discharge status: Home / Self Care | Payer: Self-pay | Source: Home / Self Care | Attending: Surgery

## 2021-06-13 ENCOUNTER — Other Ambulatory Visit: Payer: Self-pay

## 2021-06-13 ENCOUNTER — Ambulatory Visit
Admission: RE | Admit: 2021-06-13 | Discharge: 2021-06-13 | Disposition: A | Discharge status: Home / Self Care | Payer: BC Managed Care – PPO | Source: Ambulatory Visit | Attending: Surgery | Admitting: Surgery

## 2021-06-13 DIAGNOSIS — C50411 Malignant neoplasm of upper-outer quadrant of right female breast: Secondary | ICD-10-CM

## 2021-06-13 DIAGNOSIS — I10 Essential (primary) hypertension: Secondary | ICD-10-CM

## 2021-06-13 DIAGNOSIS — Z87891 Personal history of nicotine dependence: Secondary | ICD-10-CM | POA: Insufficient documentation

## 2021-06-13 DIAGNOSIS — I447 Left bundle-branch block, unspecified: Secondary | ICD-10-CM | POA: Diagnosis not present

## 2021-06-13 DIAGNOSIS — Z170 Estrogen receptor positive status [ER+]: Secondary | ICD-10-CM

## 2021-06-13 HISTORY — PX: BREAST LUMPECTOMY,RADIO FREQ LOCALIZER,AXILLARY SENTINEL LYMPH NODE BIOPSY: SHX6900

## 2021-06-13 LAB — CBC WITH DIFFERENTIAL/PLATELET
Abs Immature Granulocytes: 0.01 10*3/uL (ref 0.00–0.07)
Basophils Absolute: 0 10*3/uL (ref 0.0–0.1)
Basophils Relative: 1 %
Eosinophils Absolute: 0.3 10*3/uL (ref 0.0–0.5)
Eosinophils Relative: 5 %
HCT: 32.7 % — ABNORMAL LOW (ref 36.0–46.0)
Hemoglobin: 9.7 g/dL — ABNORMAL LOW (ref 12.0–15.0)
Immature Granulocytes: 0 %
Lymphocytes Relative: 28 %
Lymphs Abs: 1.4 10*3/uL (ref 0.7–4.0)
MCH: 23.7 pg — ABNORMAL LOW (ref 26.0–34.0)
MCHC: 29.7 g/dL — ABNORMAL LOW (ref 30.0–36.0)
MCV: 79.8 fL — ABNORMAL LOW (ref 80.0–100.0)
Monocytes Absolute: 0.5 10*3/uL (ref 0.1–1.0)
Monocytes Relative: 10 %
Neutro Abs: 2.7 10*3/uL (ref 1.7–7.7)
Neutrophils Relative %: 56 %
Platelets: 316 10*3/uL (ref 150–400)
RBC: 4.1 MIL/uL (ref 3.87–5.11)
RDW: 14.2 % (ref 11.5–15.5)
WBC: 4.8 10*3/uL (ref 4.0–10.5)
nRBC: 0 % (ref 0.0–0.2)

## 2021-06-13 LAB — BASIC METABOLIC PANEL
Anion gap: 7 (ref 5–15)
BUN: 15 mg/dL (ref 8–23)
CO2: 22 mmol/L (ref 22–32)
Calcium: 8.9 mg/dL (ref 8.9–10.3)
Chloride: 109 mmol/L (ref 98–111)
Creatinine, Ser: 0.56 mg/dL (ref 0.44–1.00)
GFR, Estimated: >60 mL/min (ref >60)
Glucose, Bld: 89 mg/dL (ref 70–99)
Potassium: 3.9 mmol/L (ref 3.5–5.1)
Sodium: 138 mmol/L (ref 135–145)

## 2021-06-13 SURGERY — BREAST LUMPECTOMY,RADIO FREQ LOCALIZER,AXILLARY SENTINEL LYMPH NODE BIOPSY
Anesthesia: General | Site: Breast | Laterality: Right

## 2021-06-13 MED ORDER — PROPOFOL 10 MG/ML IV BOLUS
INTRAVENOUS | Status: DC | PRN
  Administered 2021-06-13: 120 mg via INTRAVENOUS

## 2021-06-13 MED ORDER — LIDOCAINE HCL 1 % IJ SOLN
INTRAMUSCULAR | Status: DC | PRN
  Administered 2021-06-13: 10 mL

## 2021-06-13 MED ORDER — ONDANSETRON HCL 4 MG/2ML IJ SOLN
INTRAMUSCULAR | Status: DC | PRN
  Administered 2021-06-13: 4 mg via INTRAVENOUS

## 2021-06-13 MED ORDER — CHLORHEXIDINE GLUCONATE 0.12 % MT SOLN
OROMUCOSAL | Status: AC
  Administered 2021-06-13: 15 mL via OROMUCOSAL
  Filled 2021-06-13: qty 15

## 2021-06-13 MED ORDER — FAMOTIDINE 20 MG PO TABS: 20.0000 mg | ORAL_TABLET | Freq: Once | ORAL | Status: AC

## 2021-06-13 MED ORDER — DOCUSATE SODIUM 100 MG PO CAPS: 100.0000 mg | ORAL_CAPSULE | Freq: Two times a day (BID) | ORAL | 0 refills | Status: AC | PRN

## 2021-06-13 MED ORDER — KETOROLAC TROMETHAMINE 30 MG/ML IJ SOLN
INTRAMUSCULAR | Status: DC | PRN
  Administered 2021-06-13: 30 mg via INTRAVENOUS

## 2021-06-13 MED ORDER — BUPIVACAINE-EPINEPHRINE 0.5% -1:200000 IJ SOLN
INTRAMUSCULAR | Status: DC | PRN
  Administered 2021-06-13: 10 mL

## 2021-06-13 MED ORDER — LACTATED RINGERS IV SOLN: INTRAVENOUS | Status: DC

## 2021-06-13 MED ORDER — ORAL CARE MOUTH RINSE: 15.0000 mL | Freq: Once | OROMUCOSAL | Status: AC

## 2021-06-13 MED ORDER — LIDOCAINE HCL (PF) 1 % IJ SOLN
INTRAMUSCULAR | Status: AC
  Filled 2021-06-13: qty 30

## 2021-06-13 MED ORDER — PHENYLEPHRINE HCL (PRESSORS) 10 MG/ML IV SOLN
INTRAVENOUS | Status: AC
  Filled 2021-06-13: qty 1

## 2021-06-13 MED ORDER — FENTANYL CITRATE (PF) 100 MCG/2ML IJ SOLN
INTRAMUSCULAR | Status: AC
  Filled 2021-06-13: qty 2

## 2021-06-13 MED ORDER — MIDAZOLAM HCL 2 MG/2ML IJ SOLN
INTRAMUSCULAR | Status: AC
  Filled 2021-06-13: qty 2

## 2021-06-13 MED ORDER — IBUPROFEN 800 MG PO TABS: 800.0000 mg | ORAL_TABLET | Freq: Three times a day (TID) | ORAL | 0 refills | Status: DC | PRN

## 2021-06-13 MED ORDER — FENTANYL CITRATE (PF) 100 MCG/2ML IJ SOLN: 25.0000 ug | INTRAMUSCULAR | Status: DC | PRN

## 2021-06-13 MED ORDER — BUPIVACAINE-EPINEPHRINE (PF) 0.5% -1:200000 IJ SOLN
INTRAMUSCULAR | Status: AC
  Filled 2021-06-13: qty 30

## 2021-06-13 MED ORDER — HYDROCODONE-ACETAMINOPHEN 5-325 MG PO TABS
1.0000 | ORAL_TABLET | Freq: Four times a day (QID) | ORAL | 0 refills | Status: DC | PRN
Start: 2021-06-13 — End: 2022-10-28

## 2021-06-13 MED ORDER — PROPOFOL 10 MG/ML IV BOLUS
INTRAVENOUS | Status: AC
  Filled 2021-06-13: qty 20

## 2021-06-13 MED ORDER — GLYCOPYRROLATE 0.2 MG/ML IJ SOLN
INTRAMUSCULAR | Status: AC
  Filled 2021-06-13: qty 1

## 2021-06-13 MED ORDER — EPHEDRINE SULFATE 50 MG/ML IJ SOLN
INTRAMUSCULAR | Status: DC | PRN
  Administered 2021-06-13: 15 mg via INTRAVENOUS
  Administered 2021-06-13: 10 mg via INTRAVENOUS

## 2021-06-13 MED ORDER — DEXMEDETOMIDINE HCL IN NACL 200 MCG/50ML IV SOLN
INTRAVENOUS | Status: DC | PRN
  Administered 2021-06-13 (×2): 10 ug via INTRAVENOUS

## 2021-06-13 MED ORDER — CHLORHEXIDINE GLUCONATE CLOTH 2 % EX PADS: 6.0000 | MEDICATED_PAD | Freq: Once | CUTANEOUS | Status: DC

## 2021-06-13 MED ORDER — EPHEDRINE 5 MG/ML INJ
INTRAVENOUS | Status: AC
  Filled 2021-06-13: qty 5

## 2021-06-13 MED ORDER — ACETAMINOPHEN 10 MG/ML IV SOLN
INTRAVENOUS | Status: AC
  Filled 2021-06-13: qty 100

## 2021-06-13 MED ORDER — DEXAMETHASONE SODIUM PHOSPHATE 10 MG/ML IJ SOLN
INTRAMUSCULAR | Status: DC | PRN
  Administered 2021-06-13: 10 mg via INTRAVENOUS

## 2021-06-13 MED ORDER — OXYCODONE HCL 5 MG/5ML PO SOLN: 5.0000 mg | Freq: Once | ORAL | Status: DC | PRN

## 2021-06-13 MED ORDER — CEFAZOLIN SODIUM-DEXTROSE 2-4 GM/100ML-% IV SOLN
INTRAVENOUS | Status: AC
  Filled 2021-06-13: qty 100

## 2021-06-13 MED ORDER — FENTANYL CITRATE (PF) 100 MCG/2ML IJ SOLN
INTRAMUSCULAR | Status: DC | PRN
  Administered 2021-06-13 (×4): 25 ug via INTRAVENOUS

## 2021-06-13 MED ORDER — TECHNETIUM TC 99M TILMANOCEPT KIT
1.0000 | PACK | Freq: Once | INTRAVENOUS | Status: AC | PRN
  Administered 2021-06-13: 1.06 via INTRADERMAL

## 2021-06-13 MED ORDER — CEFAZOLIN SODIUM-DEXTROSE 2-4 GM/100ML-% IV SOLN
2.0000 g | INTRAVENOUS | Status: AC
  Administered 2021-06-13: 2 g via INTRAVENOUS

## 2021-06-13 MED ORDER — CHLORHEXIDINE GLUCONATE 0.12 % MT SOLN: 15.0000 mL | Freq: Once | OROMUCOSAL | Status: AC

## 2021-06-13 MED ORDER — SEVOFLURANE IN SOLN
RESPIRATORY_TRACT | Status: AC
  Filled 2021-06-13: qty 250

## 2021-06-13 MED ORDER — ACETAMINOPHEN 325 MG PO TABS: 650.0000 mg | ORAL_TABLET | Freq: Three times a day (TID) | ORAL | 0 refills | Status: AC | PRN

## 2021-06-13 MED ORDER — ONDANSETRON HCL 4 MG/2ML IJ SOLN: 4.0000 mg | Freq: Once | INTRAMUSCULAR | Status: DC | PRN

## 2021-06-13 MED ORDER — OXYCODONE HCL 5 MG PO TABS: 5.0000 mg | ORAL_TABLET | Freq: Once | ORAL | Status: DC | PRN

## 2021-06-13 MED ORDER — GLYCOPYRROLATE 0.2 MG/ML IJ SOLN
INTRAMUSCULAR | Status: DC | PRN
  Administered 2021-06-13 (×2): .2 mg via INTRAVENOUS

## 2021-06-13 MED ORDER — FAMOTIDINE 20 MG PO TABS
ORAL_TABLET | ORAL | Status: AC
  Administered 2021-06-13: 20 mg via ORAL
  Filled 2021-06-13: qty 1

## 2021-06-13 MED ORDER — KETOROLAC TROMETHAMINE 30 MG/ML IJ SOLN
INTRAMUSCULAR | Status: AC
  Filled 2021-06-13: qty 1

## 2021-06-13 MED ORDER — PHENYLEPHRINE HCL (PRESSORS) 10 MG/ML IV SOLN
INTRAVENOUS | Status: DC | PRN
Start: 2021-06-13 — End: 2021-06-13
  Administered 2021-06-13: 100 ug via INTRAVENOUS

## 2021-06-13 MED ORDER — LIDOCAINE HCL (CARDIAC) PF 100 MG/5ML IV SOSY
PREFILLED_SYRINGE | INTRAVENOUS | Status: DC | PRN
  Administered 2021-06-13: 60 mg via INTRAVENOUS

## 2021-06-13 MED ORDER — MIDAZOLAM HCL 2 MG/2ML IJ SOLN
INTRAMUSCULAR | Status: DC | PRN
  Administered 2021-06-13: 2 mg via INTRAVENOUS

## 2021-06-13 SURGICAL SUPPLY — 49 items
ADH SKN CLS APL DERMABOND .7 (GAUZE/BANDAGES/DRESSINGS) ×1
APL PRP STRL LF DISP 70% ISPRP (MISCELLANEOUS) ×1
APPLIER CLIP 11 MED OPEN (CLIP)
APR CLP MED 11 20 MLT OPN (CLIP)
BLADE SURG 15 STRL LF DISP TIS (BLADE) ×1 IMPLANT
BLADE SURG 15 STRL SS (BLADE) ×2
CHLORAPREP W/TINT 26 (MISCELLANEOUS) ×2 IMPLANT
CLIP APPLIE 11 MED OPEN (CLIP) IMPLANT
CNTNR SPEC 2.5X3XGRAD LEK (MISCELLANEOUS)
CONT SPEC 4OZ STER OR WHT (MISCELLANEOUS)
CONT SPEC 4OZ STRL OR WHT (MISCELLANEOUS)
CONTAINER SPEC 2.5X3XGRAD LEK (MISCELLANEOUS) IMPLANT
DERMABOND ADVANCED (GAUZE/BANDAGES/DRESSINGS) ×1
DERMABOND ADVANCED .7 DNX12 (GAUZE/BANDAGES/DRESSINGS) ×1 IMPLANT
DEVICE DSSCT PLSMBLD 3.0S LGHT (MISCELLANEOUS) ×1 IMPLANT
DEVICE DUBIN SPECIMEN MAMMOGRA (MISCELLANEOUS) ×2 IMPLANT
DRAPE LAPAROTOMY 77X122 PED (DRAPES) ×2 IMPLANT
ELECT CAUTERY BLADE TIP 2.5 (TIP) ×2
ELECT REM PT RETURN 9FT ADLT (ELECTROSURGICAL) ×2
ELECTRODE CAUTERY BLDE TIP 2.5 (TIP) ×1 IMPLANT
ELECTRODE REM PT RTRN 9FT ADLT (ELECTROSURGICAL) ×1 IMPLANT
GAUZE 4X4 16PLY ~~LOC~~+RFID DBL (SPONGE) ×2 IMPLANT
GLOVE SURG SYN 6.5 ES PF (GLOVE) ×6 IMPLANT
GLOVE SURG SYN 6.5 PF PI (GLOVE) ×2 IMPLANT
GLOVE SURG UNDER POLY LF SZ7 (GLOVE) ×4 IMPLANT
GOWN STRL REUS W/ TWL LRG LVL3 (GOWN DISPOSABLE) ×3 IMPLANT
GOWN STRL REUS W/TWL LRG LVL3 (GOWN DISPOSABLE) ×6
KIT MARKER MARGIN INK (KITS) ×2 IMPLANT
KIT TURNOVER KIT A (KITS) ×2 IMPLANT
LABEL OR SOLS (LABEL) ×2 IMPLANT
LIGHT WAVEGUIDE WIDE FLAT (MISCELLANEOUS) IMPLANT
MANIFOLD NEPTUNE II (INSTRUMENTS) ×2 IMPLANT
MARKER MARGIN CORRECT CLIP (MARKER) ×2 IMPLANT
NEEDLE HYPO 22GX1.5 SAFETY (NEEDLE) ×4 IMPLANT
PACK BASIN MINOR ARMC (MISCELLANEOUS) ×2 IMPLANT
PLASMABLADE 3.0S W/LIGHT (MISCELLANEOUS) ×2
SET LOCALIZER 20 PROBE US (MISCELLANEOUS) ×2 IMPLANT
SUT MNCRL 4-0 (SUTURE) ×4
SUT MNCRL 4-0 27XMFL (SUTURE) ×2
SUT SILK 3 0 12 30 (SUTURE) IMPLANT
SUT VIC AB 3-0 SH 27 (SUTURE) ×4
SUT VIC AB 3-0 SH 27X BRD (SUTURE) ×2 IMPLANT
SUTURE MNCRL 4-0 27XMF (SUTURE) ×2 IMPLANT
SYR 20ML LL LF (SYRINGE) ×2 IMPLANT
SYR BULB IRRIG 60ML STRL (SYRINGE) ×2 IMPLANT
TRAP NEPTUNE SPECIMEN COLLECT (MISCELLANEOUS) ×2 IMPLANT
TUBING CONNECTING 10 (TUBING) ×2 IMPLANT
WATER STERILE IRR 1000ML POUR (IV SOLUTION) ×2 IMPLANT
WATER STERILE IRR 500ML POUR (IV SOLUTION) ×1 IMPLANT

## 2021-06-13 NOTE — Interval H&P Note (Signed)
History and Physical Interval Note:  06/13/2021 9:40 AM  Julia Sandoval  has presented today for surgery, with the diagnosis of Malignant neoplasm of upper-outer quadrant of right female breast, unspecified estrogen receptor status CMS-HCC C50.411.  The various methods of treatment have been discussed with the patient and family. After consideration of risks, benefits and other options for treatment, the patient has consented to  Procedure(s): [place] (Right) as a surgical intervention.  The patient's history has been reviewed, patient examined, no change in status, stable for surgery.  I have reviewed the patient's chart and labs.  Questions were answered to the patient's satisfaction.     Yakov Bergen Lysle Pearl

## 2021-06-13 NOTE — Op Note (Signed)
Preoperative diagnosis:  right breast carcinoma.  Postoperative diagnosis: same.   Procedure: RF tag-localized right breast partial mastectomy.                      right Axillary Sentinel Lymph node biopsy  Anesthesia: GETA  Surgeon: Dr. Benjamine Sprague  Wound Classification: Clean  Indications: Patient is a 75 y.o. female with a nonpalpable right  breast mass noted on mammography with core biopsy demonstrating carcinoma, requires RF localizer placement, partial mastectomy for treatment with sentinel lymph node biopsy.   Specimen: right  Breast mass, Sentinel Lymph nodes x 1  Complications: None  Estimated Blood Loss: 18mL  Findings: 1. Specimen mammography shows marker and RF localizer on specimen 2. Pathology call refers gross examination of margins was negative 3. No other palpable mass or lymph node identified.   Operation performed with curative intent:Yes  Tracer(s) used to identify sentinel nodes in the upfront surgery (non-neoadjuvant) setting (select all that apply):Radioactive Tracer  Tracer(s) used to identify sentinel nodes in the neoadjuvant setting (select all that apply):N/A  All nodes (colored or non-colored) present at the end of a dye-filled lymphatic channel were removed:N/A  All significantly radioactive nodes were removed:Yes  All palpable suspicious nodes were removed:N/A  Biopsy-proven positive nodes marked with clips prior to chemotherapy were identified and removed:N/A    Description of procedure: RF localization was performed by radiology prior to procedure. In the nuclear medicine suite, the subareolar region was injected with Tc-99 sulfur colloid the morning of procedure. Localization studies were reviewed. The patient was taken to the operating room and placed supine on the operating table, and after general anesthesia the right  breast and axilla were prepped and draped in the usual sterile fashion. A time-out was completed verifying correct  patient, procedure, site, positioning, and implant(s) and/or special equipment prior to beginning this procedure.  By identifying the RF localizer, the probable trajectory and location of the mass was visualized. A skin incision was planned in such a way as to minimize the amount of dissection to reach the mass.  The skin incision was made after infusion of local. Flaps were raised and  Sharp and blunt dissection was then taken down to the mass, taking care to include the entire RF localizer and a margin of grossly normal tissue. The specimen was removed. The specimen was oriented with paint and sent to radiology with the localization studies. Confirmation was received that the entire target lesion had been resected. A hand-held gamma probe was used to identify the location of the hottest spot in the axilla. An incision was made around the caudal axillary hairline. Sharp and blunt Dissection was carried down to subdermal facias. The probe was placed within wound and again, the point of maximal count was found. Dissection continue until nodule was identified. The probe was placed in contact with the node and 1300 counts were recorded. The node was excised in its entirety. Ex vivo, the node measured 1300 counts when placed on the probe. The bed of the node measured <100 counts. No additional hot spots were identified. No clinically abnormal nodes were palpated. Both wounds irrigated, hemostasis was achieved and the wound closed in layers with  interrupted sutures of 3-0 Vicryl in deep dermal layer and a running subcuticular suture of Monocryl 4-0, then dressed with dermabond. The patient tolerated the procedure well and was taken to the postanesthesia care unit in stable condition. Sponge and instrument count correct at end of procedure.

## 2021-06-13 NOTE — Discharge Instructions (Addendum)
Removal, Care After This sheet gives you information about how to care for yourself after your procedure. Your health care provider may also give you more specific instructions. If you have problems or questions, contact your health care provider. What can I expect after the procedure? After the procedure, it is common to have: Soreness. Bruising. Itching. Follow these instructions at home: site care Follow instructions from your health care provider about how to take care of your site. Make sure you: Wash your hands with soap and water before and after you change your bandage (dressing). If soap and water are not available, use hand sanitizer. Leave stitches (sutures), skin glue, or adhesive strips in place. These skin closures may need to stay in place for 2 weeks or longer. If adhesive strip edges start to loosen and curl up, you may trim the loose edges. Do not remove adhesive strips completely unless your health care provider tells you to do that. If the area bleeds or bruises, apply gentle pressure for 10 minutes. OK TO SHOWER IN 24HRS  Check your site every day for signs of infection. Check for: Redness, swelling, or pain. Fluid or blood. Warmth. Pus or a bad smell.  General instructions Rest and then return to your normal activities as told by your health care provider.  tylenol and advil as needed for discomfort.  Please alternate between the two every four hours as needed for pain.    Use narcotics, if prescribed, only when tylenol and motrin is not enough to control pain.  325-650mg every 8hrs to max of 3000mg/24hrs (including the 325mg in every norco dose) for the tylenol.    Advil up to 800mg per dose every 8hrs as needed for pain.   Keep all follow-up visits as told by your health care provider. This is important. Contact a health care provider if: You have redness, swelling, or pain around your site. You have fluid or blood coming from your site. Your site feels warm to  the touch. You have pus or a bad smell coming from your site. You have a fever. Your sutures, skin glue, or adhesive strips loosen or come off sooner than expected. Get help right away if: You have bleeding that does not stop with pressure or a dressing. Summary After the procedure, it is common to have some soreness, bruising, and itching at the site. Follow instructions from your health care provider about how to take care of your site. Check your site every day for signs of infection. Contact a health care provider if you have redness, swelling, or pain around your site, or your site feels warm to the touch. Keep all follow-up visits as told by your health care provider. This is important. This information is not intended to replace advice given to you by your health care provider. Make sure you discuss any questions you have with your health care provider. Document Released: 07/20/2015 Document Revised: 12/21/2017 Document Reviewed: 12/21/2017 Elsevier Interactive Patient Education  2019 [employer]  AMBULATORY SURGERY  DISCHARGE INSTRUCTIONS   The drugs that you were given will stay in your system until tomorrow so for the next 24 hours you should not:  Drive an automobile Make any legal decisions Drink any alcoholic beverage   You may resume regular meals tomorrow.  Today it is better to start with liquids and gradually work up to solid foods.  You may eat anything you prefer, but it is better to start with liquids, then soup and crackers, and gradually   work up to solid foods.   Please notify your doctor immediately if you have any unusual bleeding, trouble breathing, redness and pain at the surgery site, drainage, fever, or pain not relieved by medication.    Additional Instructions:        Please contact your physician with any problems or Same Day Surgery at [phone], Monday through Friday 6 am to 4 pm, or [place] [facility] [place] Main number at  [phone]. 

## 2021-06-13 NOTE — Transfer of Care (Signed)
Immediate Anesthesia Transfer of Care Note  Patient: Julia Sandoval  Procedure(s) Performed: BREAST LUMPECTOMY,RADIO FREQ LOCALIZER,AXILLARY SENTINEL LYMPH NODE BIOPSY (Right: Breast)  Patient Location: PACU  Anesthesia Type:General  Level of Consciousness: awake and drowsy  Airway & Oxygen Therapy: Patient Spontanous Breathing and Patient connected to face mask oxygen  Post-op Assessment: Report given to RN and Post -op Vital signs reviewed and stable  Post vital signs: Reviewed and stable  Last Vitals:  Vitals Value Taken Time  BP 143/64 06/13/21 1121  Temp    Pulse 81 06/13/21 1121  Resp 0 06/13/21 1121  SpO2 99 % 06/13/21 1121    Last Pain:  Vitals:   06/13/21 0924  TempSrc: Oral  PainSc: 0-No pain         Complications: No notable events documented.

## 2021-06-13 NOTE — Anesthesia Preprocedure Evaluation (Addendum)
Anesthesia Evaluation  Patient identified by MRN, date of birth, ID band Patient awake    Reviewed: Allergy & Precautions, H&P , NPO status , Patient's Chart, lab work & pertinent test results  History of Anesthesia Complications Negative for: history of anesthetic complications  Airway Mallampati: II  TM Distance: >3 FB Neck ROM: full    Dental no notable dental hx.    Pulmonary neg sleep apnea, neg COPD, former smoker,    Pulmonary exam normal        Cardiovascular hypertension, (-) angina(-) Past MI and (-) Cardiac Stents Normal cardiovascular exam+ dysrhythmias (LBBB)      Neuro/Psych PSYCHIATRIC DISORDERS Anxiety Depression negative neurological ROS     GI/Hepatic negative GI ROS, Neg liver ROS,   Endo/Other  negative endocrine ROS  Renal/GU Renal disease (stones)     Musculoskeletal   Abdominal   Peds  Hematology negative hematology ROS (+)   Anesthesia Other Findings Breast CA  Reproductive/Obstetrics negative OB ROS                            Anesthesia Physical Anesthesia Plan  ASA: 2  Anesthesia Plan: General ETT   Post-op Pain Management:    Induction:   PONV Risk Score and Plan: Ondansetron, Dexamethasone and Treatment may vary due to age or medical condition  Airway Management Planned:   Additional Equipment:   Intra-op Plan:   Post-operative Plan:   Informed Consent: I have reviewed the patients History and Physical, chart, labs and discussed the procedure including the risks, benefits and alternatives for the proposed anesthesia with the patient or authorized representative who has indicated his/her understanding and acceptance.     Dental Advisory Given  Plan Discussed with: Anesthesiologist, CRNA and Surgeon  Anesthesia Plan Comments:         Anesthesia Quick Evaluation

## 2021-06-13 NOTE — Anesthesia Procedure Notes (Signed)
Procedure Name: LMA Insertion Date/Time: 06/13/2021 10:11 AM Performed by: Debe Coder, CRNA Pre-anesthesia Checklist: Patient identified, Emergency Drugs available, Suction available and Patient being monitored Patient Re-evaluated:Patient Re-evaluated prior to induction Oxygen Delivery Method: Circle system utilized Preoxygenation: Pre-oxygenation with 100% oxygen Induction Type: IV induction Ventilation: Mask ventilation without difficulty LMA: LMA inserted LMA Size: 3.5 Number of attempts: 1 Placement Confirmation: positive ETCO2 and breath sounds checked- equal and bilateral Tube secured with: Tape Dental Injury: Teeth and Oropharynx as per pre-operative assessment

## 2021-06-14 NOTE — Anesthesia Postprocedure Evaluation (Signed)
Anesthesia Post Note  Patient: Julia Sandoval  Procedure(s) Performed: BREAST LUMPECTOMY,RADIO FREQ LOCALIZER,AXILLARY SENTINEL LYMPH NODE BIOPSY (Right: Breast)  Patient location during evaluation: PACU Anesthesia Type: General Level of consciousness: awake and alert Pain management: pain level controlled Vital Signs Assessment: post-procedure vital signs reviewed and stable Respiratory status: spontaneous breathing, nonlabored ventilation, respiratory function stable and patient connected to nasal cannula oxygen Cardiovascular status: blood pressure returned to baseline and stable Postop Assessment: no apparent nausea or vomiting Anesthetic complications: no   No notable events documented.   Last Vitals:  Vitals:   06/13/21 1150 06/13/21 1206  BP: 139/69 (!) 149/68  Pulse: 76 71  Resp: 12 16  Temp:  (!) 36.3 C  SpO2: 96% 98%    Last Pain:  Vitals:   06/13/21 1206  TempSrc: Temporal  PainSc: 0-No pain                 Brett Canales Ailis Rigaud

## 2021-06-17 ENCOUNTER — Other Ambulatory Visit: Payer: Self-pay | Admitting: Anatomic Pathology & Clinical Pathology

## 2021-06-17 LAB — SURGICAL PATHOLOGY

## 2021-06-18 ENCOUNTER — Ambulatory Visit: Payer: Self-pay | Admitting: Surgery

## 2021-06-18 NOTE — H&P (View-Only) (Signed)
Subjective:   CC: Malignant neoplasm of upper-outer quadrant of right female breast, unspecified estrogen receptor status (CMS-HCC) [C50.411] HPI:  Julia Sandoval is a 75 y.o. female who was referred by Barnabas Lister, * for evaluation of above. Change was noted on last screening mammogram. Patient does not routinely do self breast exams.  Age of menarche was unsure. Age of menopause was mid 66s.  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 She states she takes raloxifene for osteoporosis only.   Patient is G2P2. Patient did breast feed. Patient denies nipple discharge. Patient reports previous breast biopsy. Patient denies a personal history of breast cancer.   Pt also noted to have positive cologuard.  She states she has had blood in her stools before.  Last colonoscopy over 1yr ago per report.   Past Medical History:  has a past medical history of Anxiety, Arthritis, Benign breast lumps, Depression, and Osteoporosis, post-menopausal.   Past Surgical History:  has a past surgical history that includes Breast Lump (Right, 2015); Gastric bypass open (2013); Tonsillectomy; Cholecystectomy; Cesarean section; Hernia repair (01/25/2016); Hernia repair (06/20/2016); and repair incisional/ventral hernia (N/A, 02/03/2017).   Family History: family history includes Alzheimer's disease in her father; Stroke in her mother.   Social History:  reports that she quit smoking about 32 years ago. Her smoking use included cigarettes. She has never used smokeless tobacco. She reports that she does not drink alcohol and does not use drugs.   Current Medications: has a current medication list which includes the following prescription(s): bupropion, calcium citrate, cholecalciferol, cyanocobalamin, glucosamine/chondroitin/c/mang, ibandronate, modafinil, multivitamin, quetiapine, vortioxetine,  ferrous sulfate, omeprazole, and raloxifene.   Allergies:  Allergies as of 05/20/2021            (No Known Allergies)     ROS:  A 15 point review of systems was performed and was negative except as noted in HPI   Objective:   BP (!) 145/81    Pulse 71    Ht 162.6 cm (5' 4")    Wt 87.5 kg (193 lb)    BMI 33.13 kg/m    Constitutional :            No distress, cooperative, alert Lymphatics/Throat:     Supple with no lymphadenopathy Respiratory:     Clear to auscultation bilaterally Cardiovascular:           Regular rate and rhythm Gastrointestinal:          Soft, non-tender, non-distended, no organomegaly. Musculoskeletal:         Steady gait and movement Skin:    Cool and moist,  surgical scars present Psychiatric:      Normal affect, non-agitated, not confused Breast: Chaperone present for exam.  Left breast and axilla WNL. Right breast with needle biopsy changes, possibly palpable mass in question in outer quadrant.  No lymphadenopathy within axilla.     LABS:  SURGICAL PATHOLOGY     SURGICAL PATHOLOGY  CASE: [id number] PATIENT: Julia Sandoval  Surgical Pathology Report          Specimen Submitted:  A. Breast , right    Clinical History: History of right breast excisional; biopsy.  indeterminate 565mmass like area. Ribbon-shaped clip placed following  ultrasound guided biopsy of RIGHT breast at 10 o'clock.        DIAGNOSIS:  A. BREAST, RIGHT AT 10:00, 5 CM FROM THE NIPPLE; ULTRASOUND-GUIDED CORE  NEEDLE BIOPSY:  - INVASIVE MAMMARY CARCINOMA, NO SPECIAL TYPE.    Size of invasive carcinoma: 6 mm in this sample  Histologic grade of invasive carcinoma: Grade 2                       Glandular/tubular differentiation score: 3                       Nuclear pleomorphism score: 2                       Mitotic rate score: 1                       Total score: 6  Ductal carcinoma in situ: Present  Lymphovascular invasion: Not identified    ER/PR/HER2:  Immunohistochemistry will be performed on block A1, with  reflex to [place] for HER2 2+. The results will be reported in an addendum.    GROSS DESCRIPTION:  A. Labeled: Right breast 10:00 5 cm from nipple  Received: Formalin  Time/date in fixative: Collected and placed in formalin at 4:20 PM on  05/15/2021  Cold ischemic time: Less than 1 minute  Total fixation time: Approximately 22 hours  Core pieces: 5 cores  Size: Range from 1-1.4 cm in length and 0.2 cm in diameter  Description: Received are cores of yellow fibrofatty tissue.  Ink color: Blue  Entirely submitted in cassettes 1-2 with 3 cores in cassette 1 and 2  cores in cassette 2.    RB 05/15/2021    Final Diagnosis performed by Allena Napoleon, MD.   Electronically signed  05/16/2021 10:42:07AM  The electronic signature indicates that the named Attending Pathologist  has evaluated the specimen  Technical component performed at [facility], [address] [place],  [place] [address] Lab: [phone] Dir: Rush Farmer, MD, MMM   Professional component performed at [facility], [facility], [facility], [place], [place] [address] Lab: [phone]  Dir: Kathi Simpers, MD        RADS: CLINICAL DATA:  Recall for possible mass in the right breast.   EXAM: DIGITAL DIAGNOSTIC UNILATERAL RIGHT MAMMOGRAM WITH TOMOSYNTHESIS AND CAD; ULTRASOUND RIGHT BREAST LIMITED   TECHNIQUE: Right digital diagnostic mammography and breast tomosynthesis was performed. The images were evaluated with computer-aided detection.; Targeted ultrasound examination of the right breast was performed   COMPARISON:  Previous exam(s).   ACR Breast Density Category b: There are scattered areas of fibroglandular density.   FINDINGS: The previously noted possible mass in the right breast persists on additional views as a 0.5 cm irregular mass with associated calcifications and architectural distortion at the right breast 10 o'clock position  middle depth.   Targeted ultrasound of the right breast at the 10 o'clock position 5 cm from the nipple demonstrates 0.3 x 0.5 x 0.4 cm irregular hypoechoic mass with indistinct and angular margins, corresponding to the mammographic finding. Internal vascularity is noted.   Targeted ultrasound of the right axilla demonstrates lymph nodes with normal morphology.   IMPRESSION: 1. Suspicious 0.5 cm mass at the right breast 10 o'clock position. Recommend ultrasound-guided biopsy. 2. No right axillary lymphadenopathy.   RECOMMENDATION: Ultrasound-guided biopsy of the right breast. We will obtain an order from the clinical provider and schedule the patient for the procedure at her earliest convenience.   I have discussed the findings and recommendations with the patient. If applicable, a reminder letter  will be sent to the patient regarding the next appointment.   BI-RADS CATEGORY  4: Suspicious.     Electronically Signed   By: Ileana Roup M.D.   On: 03/04/2021 16:25   ADDENDUM: PATHOLOGY revealed: A. BREAST, RIGHT AT 10:00, 5 CM FROM THE NIPPLE; ULTRASOUND-GUIDED CORE NEEDLE BIOPSY: - INVASIVE MAMMARY CARCINOMA, NO SPECIAL TYPE. Size of invasive carcinoma: 6 mm in this sample. Histologic grade of invasive carcinoma: Grade 2. Ductal carcinoma in situ: Present. Lymphovascular invasion: Not identified.   Pathology results are CONCORDANT with imaging findings, per Dr. Valentino Saxon.     Assessment:   Malignant neoplasm of upper-outer quadrant of right female breast, unspecified estrogen receptor status (CMS-HCC) [C50.411] .  Discussed pathology in detail and recommend proceeding with lumpectomy, SLNB. Likely post op radiation afterwards.  Will need to discuss with onc, rad-onc final plans for adjuvant therapy, since she is currently on raloxifene.  Unsure why this was started due to conflicting report from patient.     Positive cologuard   Plan:   1. Malignant neoplasm  of upper-outer quadrant of right female breast, unspecified estrogen receptor status (CMS-HCC) [C50.411]  Discussed the risk of surgery including recurrence, chronic pain, post-op infxn, poor/delayed wound healing, poor cosmesis, seroma, hematoma formation, and possible re-operation to address said risks. The risks of general anesthetic, if used, includes MI, CVA, sudden death or even reaction to anesthetic medications also discussed.  Typical post-op recovery time and possbility of activity restrictions were also discussed.  Alternatives include continued observation.  Benefits include possible symptom relief, pathologic evaluation, and/or curative excision.    The patient verbalized understanding and all questions were answered to the patient's satisfaction.  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  Recommended re-excision and she agreed to proceed.  R/b/a above discussed again.  This will be done at immediately before her already schedule colonoscopy.   Positive cologuard-- our office will schedule diagnostic colonoscopy as soon as surgery above is completed

## 2021-06-18 NOTE — H&P (Signed)
Subjective: °  °CC: Malignant neoplasm of upper-outer quadrant of right female breast, unspecified estrogen receptor status (CMS-HCC) [C50.411] °HPI: ° Julia Sandoval is a 75 y.o. female who was referred by Marcus Elijah Babaoff, * for evaluation of above. Change was noted on last screening mammogram. Patient does not routinely do self breast exams.  Age of menarche was unsure. Age of menopause was mid 40s.  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 She states she takes raloxifene for osteoporosis only. °  °Patient is G2P2. Patient did breast feed. Patient denies nipple discharge. Patient reports previous breast biopsy. Patient denies a personal history of breast cancer. °  °Pt also noted to have positive cologuard.  She states she has had blood in her stools before.  Last colonoscopy over 15yrs ago per report. °  °Past Medical History:  has a past medical history of Anxiety, Arthritis, Benign breast lumps, Depression, and Osteoporosis, post-menopausal. °  °Past Surgical History:  has a past surgical history that includes Breast Lump (Right, 2015); Gastric bypass open (2013); Tonsillectomy; Cholecystectomy; Cesarean section; Hernia repair (01/25/2016); Hernia repair (06/20/2016); and repair incisional/ventral hernia (N/A, 02/03/2017). °  °Family History: family history includes Alzheimer's disease in her father; Stroke in her mother. °  °Social History:  reports that she quit smoking about 32 years ago. Her smoking use included cigarettes. She has never used smokeless tobacco. She reports that she does not drink alcohol and does not use drugs. °  °Current Medications: has a current medication list which includes the following prescription(s): bupropion, calcium citrate, cholecalciferol, cyanocobalamin, glucosamine/chondroitin/c/mang, ibandronate, modafinil, multivitamin, quetiapine, vortioxetine,  ferrous sulfate, omeprazole, and raloxifene. °  °Allergies:  °Allergies as of 05/20/2021 °           (No Known Allergies) °  °  °ROS:  °A 15 point review of systems was performed and was negative except as noted in HPI °  °Objective: °  °BP (!) 145/81    Pulse 71    Ht 162.6 cm (5' 4")    Wt 87.5 kg (193 lb)    BMI 33.13 kg/m²  °  °Constitutional :            No distress, cooperative, alert °Lymphatics/Throat:     Supple with no lymphadenopathy °Respiratory:     Clear to auscultation bilaterally °Cardiovascular:           Regular rate and rhythm °Gastrointestinal:          Soft, non-tender, non-distended, no organomegaly. °Musculoskeletal:         Steady gait and movement °Skin:    Cool and moist,  surgical scars present °Psychiatric:      Normal affect, non-agitated, not confused °Breast: Chaperone present for exam.  Left breast and axilla WNL. Right breast with needle biopsy changes, possibly palpable mass in question in outer quadrant.  No lymphadenopathy within axilla. °  °  °LABS:  °SURGICAL PATHOLOGY     SURGICAL PATHOLOGY  °CASE: [id number]  °PATIENT: Julia Sandoval  °Surgical Pathology Report  °  °  °  °  °Specimen Submitted:  °A. Breast , right  °  °Clinical History: History of right breast excisional; biopsy.  °indeterminate 5mm mass like area. Ribbon-shaped clip placed following  °ultrasound guided biopsy of RIGHT breast at 10 o'clock.  °  °  °  °DIAGNOSIS:  °A. BREAST, RIGHT AT 10:00, 5 CM FROM THE NIPPLE; ULTRASOUND-GUIDED CORE  °  NEEDLE BIOPSY:  - INVASIVE MAMMARY CARCINOMA, NO SPECIAL TYPE.    Size of invasive carcinoma: 6 mm in this sample  Histologic grade of invasive carcinoma: Grade 2                       Glandular/tubular differentiation score: 3                       Nuclear pleomorphism score: 2                       Mitotic rate score: 1                       Total score: 6  Ductal carcinoma in situ: Present  Lymphovascular invasion: Not identified    ER/PR/HER2:  Immunohistochemistry will be performed on block A1, with  reflex to Baggs for HER2 2+. The results will be reported in an addendum.    GROSS DESCRIPTION:  A. Labeled: Right breast 10:00 5 cm from nipple  Received: Formalin  Time/date in fixative: Collected and placed in formalin at 4:20 PM on  05/15/2021  Cold ischemic time: Less than 1 minute  Total fixation time: Approximately 22 hours  Core pieces: 5 cores  Size: Range from 1-1.4 cm in length and 0.2 cm in diameter  Description: Received are cores of yellow fibrofatty tissue.  Ink color: Blue  Entirely submitted in cassettes 1-2 with 3 cores in cassette 1 and 2  cores in cassette 2.    RB 05/15/2021    Final Diagnosis performed by Allena Napoleon, MD.   Electronically signed  05/16/2021 10:42:07AM  The electronic signature indicates that the named Attending Pathologist  has evaluated the specimen  Technical component performed at [facility], [address] [place],  [place] [address] Lab: [phone] Dir: Rush Farmer, MD, MMM   Professional component performed at [facility], [facility], [facility], [place], [place] [address] Lab: [phone]  Dir: Kathi Simpers, MD        RADS: CLINICAL DATA:  Recall for possible mass in the right breast.   EXAM: DIGITAL DIAGNOSTIC UNILATERAL RIGHT MAMMOGRAM WITH TOMOSYNTHESIS AND CAD; ULTRASOUND RIGHT BREAST LIMITED   TECHNIQUE: Right digital diagnostic mammography and breast tomosynthesis was performed. The images were evaluated with computer-aided detection.; Targeted ultrasound examination of the right breast was performed   COMPARISON:  Previous exam(s).   ACR Breast Density Category b: There are scattered areas of fibroglandular density.   FINDINGS: The previously noted possible mass in the right breast persists on additional views as a 0.5 cm irregular mass with associated calcifications and architectural distortion at the right breast 10 o'clock position  middle depth.   Targeted ultrasound of the right breast at the 10 o'clock position 5 cm from the nipple demonstrates 0.3 x 0.5 x 0.4 cm irregular hypoechoic mass with indistinct and angular margins, corresponding to the mammographic finding. Internal vascularity is noted.   Targeted ultrasound of the right axilla demonstrates lymph nodes with normal morphology.   IMPRESSION: 1. Suspicious 0.5 cm mass at the right breast 10 o'clock position. Recommend ultrasound-guided biopsy. 2. No right axillary lymphadenopathy.   RECOMMENDATION: Ultrasound-guided biopsy of the right breast. We will obtain an order from the clinical provider and schedule the patient for the procedure at her earliest convenience.   I have discussed the findings and recommendations with the patient. If applicable, a reminder letter  will be sent to the patient regarding the next appointment.   BI-RADS CATEGORY  4: Suspicious.     Electronically Signed   By: Ileana Roup M.D.   On: 03/04/2021 16:25   ADDENDUM: PATHOLOGY revealed: A. BREAST, RIGHT AT 10:00, 5 CM FROM THE NIPPLE; ULTRASOUND-GUIDED CORE NEEDLE BIOPSY: - INVASIVE MAMMARY CARCINOMA, NO SPECIAL TYPE. Size of invasive carcinoma: 6 mm in this sample. Histologic grade of invasive carcinoma: Grade 2. Ductal carcinoma in situ: Present. Lymphovascular invasion: Not identified.   Pathology results are CONCORDANT with imaging findings, per Dr. Valentino Saxon.     Assessment:   Malignant neoplasm of upper-outer quadrant of right female breast, unspecified estrogen receptor status (CMS-HCC) [C50.411] .  Discussed pathology in detail and recommend proceeding with lumpectomy, SLNB. Likely post op radiation afterwards.  Will need to discuss with onc, rad-onc final plans for adjuvant therapy, since she is currently on raloxifene.  Unsure why this was started due to conflicting report from patient.     Positive cologuard   Plan:   1. Malignant neoplasm  of upper-outer quadrant of right female breast, unspecified estrogen receptor status (CMS-HCC) [C50.411]  Discussed the risk of surgery including recurrence, chronic pain, post-op infxn, poor/delayed wound healing, poor cosmesis, seroma, hematoma formation, and possible re-operation to address said risks. The risks of general anesthetic, if used, includes MI, CVA, sudden death or even reaction to anesthetic medications also discussed.  Typical post-op recovery time and possbility of activity restrictions were also discussed.  Alternatives include continued observation.  Benefits include possible symptom relief, pathologic evaluation, and/or curative excision.    The patient verbalized understanding and all questions were answered to the patient's satisfaction.  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  Recommended re-excision and she agreed to proceed.  R/b/a above discussed again.  This will be done at immediately before her already schedule colonoscopy.   Positive cologuard-- our office will schedule diagnostic colonoscopy as soon as surgery above is completed

## 2021-06-21 ENCOUNTER — Other Ambulatory Visit
Admission: RE | Admit: 2021-06-21 | Discharge: 2021-06-21 | Disposition: A | Discharge status: Home / Self Care | Payer: BC Managed Care – PPO | Source: Ambulatory Visit | Attending: Surgery | Admitting: Surgery

## 2021-06-21 NOTE — Pre-Procedure Instructions (Signed)
Pre admission phone call was done with patient on 06/13/21 for same procedure that is scheduled for 06/27/21, she is having a repeat breast biopsy, this writer called to follow up with patient to ensure that she followed same instructions, that she had no new medications. Patient voices understanding of instructions and has no questions at this time.

## 2021-06-21 NOTE — Patient Instructions (Signed)
Your procedure is scheduled on: 06/27/21 - Thursday Report to the Registration Desk on the 1st floor of the [facility]. To find out your arrival time, please call [phone] between 1PM - 3PM on: 06/26/21 - Wednesday  REMEMBER: Instructions that are not followed completely may result in serious medical risk, up to and including death; or upon the discretion of your surgeon and anesthesiologist your surgery may need to be rescheduled.  Do not eat food after midnight the night before surgery.  No gum chewing, lozengers or hard candies.  You may however, drink CLEAR liquids up to 2 hours before you are scheduled to arrive for your surgery. Do not drink anything within 2 hours of your scheduled arrival time.  Clear liquids include: - water  - apple juice without pulp - gatorade (not RED, PURPLE, OR BLUE) - black coffee or tea (Do NOT add milk or creamers to the coffee or tea) Do NOT drink anything that is not on this list.  TAKE THESE MEDICATIONS THE MORNING OF SURGERY WITH A SIP OF WATER:  - buPROPion (WELLBUTRIN XL) 300 MG 24 hr tablet - vortioxetine HBr (TRINTELLIX) 20 MG TABS tablet  One week prior to surgery: Stop Anti-inflammatories (NSAIDS) such as Advil, Aleve, Ibuprofen, Motrin, Naproxen, Naprosyn and Aspirin based products such as Excedrin, Goodys Powder, BC Powder.  Stop ANY OVER THE COUNTER supplements until after surgery.  You may however, continue to take Tylenol if needed for pain up until the day of surgery.  No Alcohol for 24 hours before or after surgery.  No Smoking including e-cigarettes for 24 hours prior to surgery.  No chewable tobacco products for at least 6 hours prior to surgery.  No nicotine patches on the day of surgery.  Do not use any "recreational" drugs for at least a week prior to your surgery.  Please be advised that the combination of cocaine and anesthesia may have negative outcomes, up to and including death. If you test positive for  cocaine, your surgery will be cancelled.  On the morning of surgery brush your teeth with toothpaste and water, you may rinse your mouth with mouthwash if you wish. Do not swallow any toothpaste or mouthwash.  Use CHG Soap or wipes as directed on instruction sheet.  Do not wear jewelry, make-up, hairpins, clips or nail polish.  Do not wear lotions, powders, or perfumes.   Do not shave body from the neck down 48 hours prior to surgery just in case you cut yourself which could leave a site for infection.  Also, freshly shaved skin may become irritated if using the CHG soap.  Contact lenses, hearing aids and dentures may not be worn into surgery.  Do not bring valuables to the hospital. [facility] is not responsible for any missing/lost belongings or valuables.   Notify your doctor if there is any change in your medical condition (cold, fever, infection).  Wear comfortable clothing (specific to your surgery type) to the hospital.  After surgery, you can help prevent lung complications by doing breathing exercises.  Take deep breaths and cough every 1-2 hours. Your doctor may order a device called an Incentive Spirometer to help you take deep breaths. When coughing or sneezing, hold a pillow firmly against your incision with both hands. This is called splinting. Doing this helps protect your incision. It also decreases belly discomfort.  If you are being admitted to the hospital overnight, leave your suitcase in the car. After surgery it may be brought to  your room.  If you are being discharged the day of surgery, you will not be allowed to drive home. You will need a responsible adult (18 years or older) to drive you home and stay with you that night.   If you are taking public transportation, you will need to have a responsible adult (18 years or older) with you. Please confirm with your physician that it is acceptable to use public transportation.   Please call the [facility] Dept. at [phone] if you have any questions about these instructions.  Surgery Visitation Policy:  Patients undergoing a surgery or procedure may have one family member or support person with them as long as that person is not COVID-19 positive or experiencing its symptoms.  That person may remain in the waiting area during the procedure and may rotate out with other people.  Inpatient Visitation:    Visiting hours are 7 a.m. to 8 p.m. Up to two visitors ages 16+ are allowed at one time in a patient room. The visitors may rotate out with other people during the day. Visitors must check out when they leave, or other visitors will not be allowed. One designated support person may remain overnight. The visitor must pass COVID-19 screenings, use hand sanitizer when entering and exiting the patients room and wear a mask at all times, including in the patients room. Patients must also wear a mask when staff or their visitor are in the room. Masking is required regardless of vaccination status.

## 2021-06-27 ENCOUNTER — Encounter: Payer: Self-pay | Admitting: Anesthesiology

## 2021-06-27 ENCOUNTER — Encounter
Admission: RE | Disposition: A | Discharge status: Home / Self Care | Payer: Self-pay | Source: Home / Self Care | Attending: Surgery

## 2021-06-27 ENCOUNTER — Ambulatory Visit: Payer: BC Managed Care – PPO | Admitting: Certified Registered Nurse Anesthetist

## 2021-06-27 ENCOUNTER — Other Ambulatory Visit: Payer: Self-pay

## 2021-06-27 ENCOUNTER — Encounter: Payer: Self-pay | Admitting: Surgery

## 2021-06-27 ENCOUNTER — Ambulatory Visit
Admission: RE | Admit: 2021-06-27 | Discharge: 2021-06-27 | Disposition: A | Discharge status: Home / Self Care | Payer: BC Managed Care – PPO | Attending: Surgery | Admitting: Surgery

## 2021-06-27 DIAGNOSIS — Z87891 Personal history of nicotine dependence: Secondary | ICD-10-CM | POA: Insufficient documentation

## 2021-06-27 DIAGNOSIS — F419 Anxiety disorder, unspecified: Secondary | ICD-10-CM | POA: Diagnosis not present

## 2021-06-27 DIAGNOSIS — F32A Depression, unspecified: Secondary | ICD-10-CM | POA: Diagnosis not present

## 2021-06-27 DIAGNOSIS — Z6831 Body mass index (BMI) 31.0-31.9, adult: Secondary | ICD-10-CM | POA: Insufficient documentation

## 2021-06-27 DIAGNOSIS — Z79899 Other long term (current) drug therapy: Secondary | ICD-10-CM | POA: Insufficient documentation

## 2021-06-27 DIAGNOSIS — R195 Other fecal abnormalities: Secondary | ICD-10-CM | POA: Diagnosis present

## 2021-06-27 DIAGNOSIS — I10 Essential (primary) hypertension: Secondary | ICD-10-CM | POA: Diagnosis not present

## 2021-06-27 DIAGNOSIS — Z9884 Bariatric surgery status: Secondary | ICD-10-CM | POA: Insufficient documentation

## 2021-06-27 DIAGNOSIS — E785 Hyperlipidemia, unspecified: Secondary | ICD-10-CM | POA: Insufficient documentation

## 2021-06-27 DIAGNOSIS — E669 Obesity, unspecified: Secondary | ICD-10-CM | POA: Diagnosis not present

## 2021-06-27 DIAGNOSIS — C50411 Malignant neoplasm of upper-outer quadrant of right female breast: Secondary | ICD-10-CM | POA: Insufficient documentation

## 2021-06-27 DIAGNOSIS — I447 Left bundle-branch block, unspecified: Secondary | ICD-10-CM | POA: Diagnosis not present

## 2021-06-27 HISTORY — PX: COLONOSCOPY WITH PROPOFOL: SHX5780

## 2021-06-27 HISTORY — PX: COLONOSCOPY: SHX5424

## 2021-06-27 HISTORY — PX: RE-EXCISION OF BREAST LUMPECTOMY: SHX6048

## 2021-06-27 SURGERY — EXCISION, LESION, BREAST
Anesthesia: General | Site: Breast | Laterality: Right

## 2021-06-27 SURGERY — COLONOSCOPY WITH PROPOFOL
Anesthesia: General

## 2021-06-27 MED ORDER — CHLORHEXIDINE GLUCONATE CLOTH 2 % EX PADS: 6.0000 | MEDICATED_PAD | Freq: Once | CUTANEOUS | Status: DC

## 2021-06-27 MED ORDER — LIDOCAINE HCL 1 % IJ SOLN
INTRAMUSCULAR | Status: DC | PRN
  Administered 2021-06-27: 20 mL

## 2021-06-27 MED ORDER — STERILE WATER FOR IRRIGATION IR SOLN
Status: DC | PRN
  Administered 2021-06-27: 100 mL

## 2021-06-27 MED ORDER — HYDROCODONE-ACETAMINOPHEN 5-325 MG PO TABS
1.0000 | ORAL_TABLET | Freq: Four times a day (QID) | ORAL | 0 refills | Status: DC | PRN
Start: 2021-06-27 — End: 2022-10-28

## 2021-06-27 MED ORDER — EPHEDRINE SULFATE 50 MG/ML IJ SOLN
INTRAMUSCULAR | Status: DC | PRN
  Administered 2021-06-27 (×3): 5 mg via INTRAVENOUS

## 2021-06-27 MED ORDER — DEXMEDETOMIDINE (PRECEDEX) IN NS 20 MCG/5ML (4 MCG/ML) IV SYRINGE
PREFILLED_SYRINGE | INTRAVENOUS | Status: DC | PRN
  Administered 2021-06-27 (×2): 4 ug via INTRAVENOUS

## 2021-06-27 MED ORDER — PROPOFOL 10 MG/ML IV BOLUS
INTRAVENOUS | Status: DC | PRN
  Administered 2021-06-27: 40 mg via INTRAVENOUS
  Administered 2021-06-27: 110 mg via INTRAVENOUS
  Administered 2021-06-27: 20 mg via INTRAVENOUS
  Administered 2021-06-27: 30 mg via INTRAVENOUS

## 2021-06-27 MED ORDER — LIDOCAINE HCL (CARDIAC) PF 100 MG/5ML IV SOSY
PREFILLED_SYRINGE | INTRAVENOUS | Status: DC | PRN
  Administered 2021-06-27: 100 mg via INTRAVENOUS

## 2021-06-27 MED ORDER — GLYCOPYRROLATE 0.2 MG/ML IJ SOLN
INTRAMUSCULAR | Status: DC | PRN
  Administered 2021-06-27: .2 mg via INTRAVENOUS

## 2021-06-27 MED ORDER — ACETAMINOPHEN 10 MG/ML IV SOLN
INTRAVENOUS | Status: AC
  Filled 2021-06-27: qty 100

## 2021-06-27 MED ORDER — PHENYLEPHRINE 40 MCG/ML (10ML) SYRINGE FOR IV PUSH (FOR BLOOD PRESSURE SUPPORT)
PREFILLED_SYRINGE | INTRAVENOUS | Status: DC | PRN
  Administered 2021-06-27 (×2): 80 ug via INTRAVENOUS

## 2021-06-27 MED ORDER — ONDANSETRON HCL 4 MG/2ML IJ SOLN
INTRAMUSCULAR | Status: DC | PRN
  Administered 2021-06-27: 4 mg via INTRAVENOUS

## 2021-06-27 MED ORDER — ACETAMINOPHEN 10 MG/ML IV SOLN
INTRAVENOUS | Status: DC | PRN
  Administered 2021-06-27: 1000 mg via INTRAVENOUS

## 2021-06-27 MED ORDER — FENTANYL CITRATE (PF) 100 MCG/2ML IJ SOLN: 25.0000 ug | INTRAMUSCULAR | Status: DC | PRN

## 2021-06-27 MED ORDER — GLYCOPYRROLATE 0.2 MG/ML IJ SOLN
INTRAMUSCULAR | Status: AC
  Filled 2021-06-27: qty 1

## 2021-06-27 MED ORDER — BUPIVACAINE-EPINEPHRINE (PF) 0.5% -1:200000 IJ SOLN
INTRAMUSCULAR | Status: AC
  Filled 2021-06-27: qty 30

## 2021-06-27 MED ORDER — DEXAMETHASONE SODIUM PHOSPHATE 10 MG/ML IJ SOLN
INTRAMUSCULAR | Status: DC | PRN
  Administered 2021-06-27: 10 mg via INTRAVENOUS

## 2021-06-27 MED ORDER — CHLORHEXIDINE GLUCONATE 0.12 % MT SOLN
OROMUCOSAL | Status: AC
  Filled 2021-06-27: qty 15

## 2021-06-27 MED ORDER — LIDOCAINE HCL (PF) 1 % IJ SOLN
INTRAMUSCULAR | Status: AC
  Filled 2021-06-27: qty 30

## 2021-06-27 MED ORDER — SODIUM CHLORIDE 0.9 % IV SOLN: INTRAVENOUS | Status: DC

## 2021-06-27 MED ORDER — PROPOFOL 10 MG/ML IV BOLUS
INTRAVENOUS | Status: AC
  Filled 2021-06-27: qty 20

## 2021-06-27 MED ORDER — ONDANSETRON HCL 4 MG/2ML IJ SOLN: 4.0000 mg | Freq: Once | INTRAMUSCULAR | Status: DC | PRN

## 2021-06-27 MED ORDER — CEFAZOLIN SODIUM-DEXTROSE 2-4 GM/100ML-% IV SOLN
2.0000 g | INTRAVENOUS | Status: AC
  Administered 2021-06-27: 15:00:00 2 g via INTRAVENOUS

## 2021-06-27 MED ORDER — FENTANYL CITRATE (PF) 100 MCG/2ML IJ SOLN
INTRAMUSCULAR | Status: AC
  Filled 2021-06-27: qty 2

## 2021-06-27 MED ORDER — FENTANYL CITRATE (PF) 100 MCG/2ML IJ SOLN
INTRAMUSCULAR | Status: DC | PRN
  Administered 2021-06-27 (×2): 50 ug via INTRAVENOUS

## 2021-06-27 MED ORDER — CEFAZOLIN SODIUM-DEXTROSE 2-4 GM/100ML-% IV SOLN
INTRAVENOUS | Status: AC
  Filled 2021-06-27: qty 100

## 2021-06-27 SURGICAL SUPPLY — 42 items
BLADE SURG 15 STRL LF DISP TIS (BLADE) ×2 IMPLANT
BLADE SURG 15 STRL SS (BLADE) ×4
CHLORAPREP W/TINT 26 (MISCELLANEOUS) ×4 IMPLANT
CNTNR SPEC 2.5X3XGRAD LEK (MISCELLANEOUS) ×2
CONT SPEC 4OZ STER OR WHT (MISCELLANEOUS) ×2
CONT SPEC 4OZ STRL OR WHT (MISCELLANEOUS) ×2
CONTAINER SPEC 2.5X3XGRAD LEK (MISCELLANEOUS) ×2 IMPLANT
DERMABOND ADVANCED (GAUZE/BANDAGES/DRESSINGS) ×2
DERMABOND ADVANCED .7 DNX12 (GAUZE/BANDAGES/DRESSINGS) ×2 IMPLANT
DEVICE DSSCT PLSMBLD 3.0S LGHT (MISCELLANEOUS) ×2 IMPLANT
DEVICE DUBIN SPECIMEN MAMMOGRA (MISCELLANEOUS) ×4 IMPLANT
DRAPE LAPAROTOMY 77X122 PED (DRAPES) ×4 IMPLANT
ELECT CAUTERY BLADE TIP 2.5 (TIP) ×4
ELECT REM PT RETURN 9FT ADLT (ELECTROSURGICAL) ×4
ELECTRODE CAUTERY BLDE TIP 2.5 (TIP) ×2 IMPLANT
ELECTRODE REM PT RTRN 9FT ADLT (ELECTROSURGICAL) ×2 IMPLANT
GAUZE 4X4 16PLY ~~LOC~~+RFID DBL (SPONGE) ×4 IMPLANT
GLOVE SURG SYN 6.5 ES PF (GLOVE) ×8 IMPLANT
GLOVE SURG SYN 6.5 PF PI (GLOVE) ×4 IMPLANT
GLOVE SURG UNDER POLY LF SZ7 (GLOVE) ×4 IMPLANT
GOWN STRL REUS W/ TWL LRG LVL3 (GOWN DISPOSABLE) ×6 IMPLANT
GOWN STRL REUS W/TWL LRG LVL3 (GOWN DISPOSABLE) ×12
KIT MARKER MARGIN INK (KITS) ×4 IMPLANT
KIT TURNOVER KIT A (KITS) ×4 IMPLANT
LABEL OR SOLS (LABEL) ×4 IMPLANT
MANIFOLD NEPTUNE II (INSTRUMENTS) ×4 IMPLANT
MARKER MARGIN CORRECT CLIP (MARKER) ×4 IMPLANT
NEEDLE HYPO 22GX1.5 SAFETY (NEEDLE) ×8 IMPLANT
PACK BASIN MINOR ARMC (MISCELLANEOUS) ×4 IMPLANT
PLASMABLADE 3.0S W/LIGHT (MISCELLANEOUS) ×4
SUT MNCRL 4-0 (SUTURE) ×8
SUT MNCRL 4-0 27XMFL (SUTURE) ×4
SUT VIC AB 3-0 SH 27 (SUTURE) ×8
SUT VIC AB 3-0 SH 27X BRD (SUTURE) ×4 IMPLANT
SUTURE MNCRL 4-0 27XMF (SUTURE) ×4 IMPLANT
SYR 20ML LL LF (SYRINGE) ×4 IMPLANT
SYR BULB IRRIG 60ML STRL (SYRINGE) ×4 IMPLANT
TRAP NEPTUNE SPECIMEN COLLECT (MISCELLANEOUS) ×4 IMPLANT
TUBING CONNECTING 10 (TUBING) ×3 IMPLANT
TUBING CONNECTING 10' (TUBING) ×1
WATER STERILE IRR 1000ML POUR (IV SOLUTION) ×4 IMPLANT
WATER STERILE IRR 500ML POUR (IV SOLUTION) ×4 IMPLANT

## 2021-06-27 NOTE — Op Note (Addendum)
[facility] Gastroenterology Patient Name: Julia Sandoval Procedure Date: 06/27/2021 2:33 PM MRN: [id number] Account #: [id number] Date of Birth: 08/11/1945 Admit Type: Outpatient Age: 75 Room: [facility] ENDO ROOM 3 Gender: Female Note Status: Supervisor Override Instrument Name: Jasper Riling [id number],Colonoscope [id number] Procedure:             Colonoscopy Indications:           Positive Cologuard test Providers:             Eliseo Squires MD, MD Referring MD:          Caprice Renshaw MD (Referring MD), No Local Md, MD                         (Referring MD) Complications:         No immediate complications. Procedure:             Pre-Anesthesia Assessment:                        - After reviewing the risks and benefits, the patient                         was deemed in satisfactory condition to undergo the                         procedure in an ambulatory setting.                        After obtaining informed consent, the colonoscope was                         passed under direct vision. Throughout the procedure,                         the patient's blood pressure, pulse, and oxygen                         saturations were monitored continuously. The                         Colonoscope was introduced through the anus with the                         intention of advancing to the cecum. The scope was                         advanced to the sigmoid colon before the procedure was                         aborted. The colonoscopy was aborted due to poor bowel                         prep with stool present. Lavage did not allow for the                         successful completion of the procedure. Findings:      The perianal and digital rectal examinations were normal. Impression:            - The  procedure was aborted due to poor bowel prep                         with stool present.                        - No specimens collected.                         - The procedure was aborted due to poor bowel prep                         with stool present. Recommendation:        - Repeat colonoscopy at appointment to be scheduled                         because the bowel preparation was poor. Procedure Code(s):     --- Professional ---                        [id number], Colonoscopy, flexible; diagnostic, including                         collection of specimen(s) by brushing or washing, when                         performed (separate procedure) CPT copyright 2019 American Medical Association. All rights reserved. The codes documented in this report are preliminary and upon coder review may  be revised to meet current compliance requirements. Dr. Sheppard Penton, MD Eliseo Squires MD, MD 06/27/2021 3:59:58 PM This report has been signed electronically. Number of Addenda: 0 Note Initiated On: 06/27/2021 2:33 PM Total Procedure Duration: 0 hours 4 minutes 26 seconds  Estimated Blood Loss:  Estimated blood loss: none.      [facility]

## 2021-06-27 NOTE — Interval H&P Note (Signed)
Returns for a excision due to positive margins. Will proceed with colonoscopy right after the procedure under same anesthesia. All questions addressed no new update since previous HandP otherwise

## 2021-06-27 NOTE — Anesthesia Procedure Notes (Addendum)
Procedure Name: LMA Insertion Date/Time: 06/27/2021 2:38 PM Performed by: Lily Peer, Marcelo Ickes, CRNA Pre-anesthesia Checklist: Patient identified, Emergency Drugs available, Suction available and Patient being monitored Patient Re-evaluated:Patient Re-evaluated prior to induction Oxygen Delivery Method: Circle system utilized Preoxygenation: Pre-oxygenation with 100% oxygen Induction Type: IV induction Ventilation: Mask ventilation without difficulty LMA: LMA inserted LMA Size: 4.0 Number of attempts: 1 Placement Confirmation: positive ETCO2 and breath sounds checked- equal and bilateral Tube secured with: Tape Dental Injury: Teeth and Oropharynx as per pre-operative assessment

## 2021-06-27 NOTE — Discharge Instructions (Addendum)
Removal, Care After This sheet gives you information about how to care for yourself after your procedure. Your health care provider may also give you more specific instructions. If you have problems or questions, contact your health care provider. What can I expect after the procedure? After the procedure, it is common to have: Soreness. Bruising. Itching. Follow these instructions at home: site care Follow instructions from your health care provider about how to take care of your site. Make sure you: Wash your hands with soap and water before and after you change your bandage (dressing). If soap and water are not available, use hand sanitizer. Leave stitches (sutures), skin glue, or adhesive strips in place. These skin closures may need to stay in place for 2 weeks or longer. If adhesive strip edges start to loosen and curl up, you may trim the loose edges. Do not remove adhesive strips completely unless your health care provider tells you to do that. If the area bleeds or bruises, apply gentle pressure for 10 minutes. OK TO SHOWER IN 24HRS  Check your site every day for signs of infection. Check for: Redness, swelling, or pain. Fluid or blood. Warmth. Pus or a bad smell.  General instructions Rest and then return to your normal activities as told by your health care provider.  tylenol and advil as needed for discomfort.  Please alternate between the two every four hours as needed for pain.    Use narcotics, if prescribed, only when tylenol and motrin is not enough to control pain.  325-650mg every 8hrs to max of 3000mg/24hrs (including the 325mg in every norco dose) for the tylenol.    Advil up to 800mg per dose every 8hrs as needed for pain.   Keep all follow-up visits as told by your health care provider. This is important. Contact a health care provider if: You have redness, swelling, or pain around your site. You have fluid or blood coming from your site. Your site feels warm to  the touch. You have pus or a bad smell coming from your site. You have a fever. Your sutures, skin glue, or adhesive strips loosen or come off sooner than expected. Get help right away if: You have bleeding that does not stop with pressure or a dressing. Summary After the procedure, it is common to have some soreness, bruising, and itching at the site. Follow instructions from your health care provider about how to take care of your site. Check your site every day for signs of infection. Contact a health care provider if you have redness, swelling, or pain around your site, or your site feels warm to the touch. Keep all follow-up visits as told by your health care provider. This is important. This information is not intended to replace advice given to you by your health care provider. Make sure you discuss any questions you have with your health care provider. Document Released: 07/20/2015 Document Revised: 12/21/2017 Document Reviewed: 12/21/2017 Elsevier Interactive Patient Education  2019 [employer]  AMBULATORY SURGERY  DISCHARGE INSTRUCTIONS   The drugs that you were given will stay in your system until tomorrow so for the next 24 hours you should not:  Drive an automobile Make any legal decisions Drink any alcoholic beverage   You may resume regular meals tomorrow.  Today it is better to start with liquids and gradually work up to solid foods.  You may eat anything you prefer, but it is better to start with liquids, then soup and crackers, and gradually   work up to solid foods.   Please notify your doctor immediately if you have any unusual bleeding, trouble breathing, redness and pain at the surgery site, drainage, fever, or pain not relieved by medication.    Additional Instructions:        Please contact your physician with any problems or Same Day Surgery at [phone], Monday through Friday 6 am to 4 pm, or [place] [facility] Main number at  [phone]. 

## 2021-06-27 NOTE — Op Note (Signed)
Preoperative diagnosis:  right breast DCIS  Postoperative diagnosis: same.   Procedure:  right breast lumpectomy margin re-excision                   Anesthesia: LMA  Surgeon: Dr. Benjamine Sprague  Wound Classification: Clean  Indications: Patient is a 75 y.o. female with previous lumpectomy, final path showed medial margin positive for incidental DCIS. Here for re-excision today  Specimen: right lumpectomy site medial margin Complications: None  Estimated Blood Loss: 75mL  Findings: 1. Pathology call refers gross examination of resected margins was negative   Description of procedure:  The patient was taken to the operating room and placed supine on the operating table, and after general anesthesia the right breast and axilla were prepped and draped in the usual sterile fashion. A time-out was completed verifying correct patient, procedure, site, positioning, and implant(s) and/or special equipment prior to beginning this procedure.  The skin incision was made after infusion of local. At previous incision.  Excision cavity noted with seroma and medial margin of the cavity was excised completely.  The specimen was oriented with paint and sent to path, with margins noted to be negative.  Wound irrigated, hemostasis was achieved and the wound closed in layers with  interrupted sutures of 3-0 Vicryl in deep dermal layer and a running subcuticular suture of Monocryl 4-0, then dressed with dermabond. The patient tolerated the procedure well and was repositioned for colonoscopy.  Sponge and instrument count correct at this portion of procedure.

## 2021-06-27 NOTE — Transfer of Care (Signed)
Immediate Anesthesia Transfer of Care Note  Patient: Julia Sandoval  Procedure(s) Performed: RE-EXCISION OF BREAST LUMPECTOMY (Right: Breast) COLONOSCOPY (Right)  Patient Location: PACU  Anesthesia Type:General  Level of Consciousness: awake, alert  and patient cooperative  Airway & Oxygen Therapy: Patient Spontanous Breathing and Patient connected to face mask oxygen  Post-op Assessment: Report given to RN and Post -op Vital signs reviewed and stable  Post vital signs: Reviewed and stable  Last Vitals:  Vitals Value Taken Time  BP 146/76 06/27/21 1545  Temp    Pulse 76 06/27/21 1549  Resp 13 06/27/21 1549  SpO2 100 % 06/27/21 1549  Vitals shown include unvalidated device data.  Last Pain:  Vitals:   06/27/21 1328  TempSrc: Oral         Complications: No notable events documented.

## 2021-06-27 NOTE — Anesthesia Preprocedure Evaluation (Signed)
Anesthesia Evaluation  Patient identified by MRN, date of birth, ID band Patient awake    Reviewed: Allergy & Precautions, H&P , NPO status , Patient's Chart, lab work & pertinent test results  History of Anesthesia Complications Negative for: history of anesthetic complications  Airway Mallampati: II  TM Distance: >3 FB Neck ROM: full    Dental  (+) Dental Advidsory Given   Pulmonary neg shortness of breath, neg sleep apnea, neg COPD, neg recent URI, former smoker,    Pulmonary exam normal        Cardiovascular hypertension, (-) angina(-) Past MI and (-) Cardiac Stents Normal cardiovascular exam+ dysrhythmias (LBBB)      Neuro/Psych PSYCHIATRIC DISORDERS Anxiety Depression negative neurological ROS     GI/Hepatic negative GI ROS, Neg liver ROS,   Endo/Other  negative endocrine ROS  Renal/GU Renal disease (stones)     Musculoskeletal   Abdominal   Peds  Hematology negative hematology ROS (+)   Anesthesia Other Findings Past Medical History: No date: Depression No date: Hyperlipidemia     Comment:  Mixed No date: Hypertension     Comment:  Unspecified No date: Kidney stone No date: LBBB (left bundle branch block) No date: Obesity   Reproductive/Obstetrics negative OB ROS                             Anesthesia Physical  Anesthesia Plan  ASA: 2  Anesthesia Plan: General   Post-op Pain Management:    Induction: Intravenous  PONV Risk Score and Plan: Ondansetron, Dexamethasone and Treatment may vary due to age or medical condition  Airway Management Planned: LMA and Oral ETT  Additional Equipment:   Intra-op Plan:   Post-operative Plan: Extubation in OR  Informed Consent: I have reviewed the patients History and Physical, chart, labs and discussed the procedure including the risks, benefits and alternatives for the proposed anesthesia with the patient or authorized  representative who has indicated his/her understanding and acceptance.     Dental Advisory Given  Plan Discussed with: Anesthesiologist, CRNA and Surgeon  Anesthesia Plan Comments:         Anesthesia Quick Evaluation

## 2021-06-28 ENCOUNTER — Encounter: Payer: Self-pay | Admitting: Surgery

## 2021-06-28 NOTE — Anesthesia Postprocedure Evaluation (Signed)
Anesthesia Post Note  Patient: Julia Sandoval  Procedure(s) Performed: RE-EXCISION OF BREAST LUMPECTOMY (Right: Breast) COLONOSCOPY (Right)  Patient location during evaluation: PACU Anesthesia Type: General Level of consciousness: awake and alert Pain management: pain level controlled Vital Signs Assessment: post-procedure vital signs reviewed and stable Respiratory status: spontaneous breathing, nonlabored ventilation, respiratory function stable and patient connected to nasal cannula oxygen Cardiovascular status: blood pressure returned to baseline and stable Postop Assessment: no apparent nausea or vomiting Anesthetic complications: no   No notable events documented.   Last Vitals:  Vitals:   06/27/21 1625 06/27/21 1649  BP:  (!) 143/68  Pulse: 77 68  Resp: (!) 24 20  Temp: (!) 36.4 C (!) 36.3 C  SpO2: 95% 98%    Last Pain:  Vitals:   06/27/21 1649  TempSrc: Temporal  PainSc: 0-No pain                 Martha Clan

## 2021-07-02 ENCOUNTER — Inpatient Hospital Stay: Payer: BC Managed Care – PPO | Attending: Internal Medicine | Admitting: Internal Medicine

## 2021-07-02 LAB — SURGICAL PATHOLOGY

## 2021-07-26 ENCOUNTER — Telehealth: Payer: Self-pay | Admitting: Internal Medicine

## 2021-07-26 NOTE — Telephone Encounter (Signed)
Pt called to set up an appt. To see MD. Call back at [phone]

## 2021-08-02 ENCOUNTER — Inpatient Hospital Stay: Payer: BC Managed Care – PPO | Admitting: Internal Medicine

## 2021-08-02 ENCOUNTER — Telehealth: Payer: Self-pay | Admitting: Internal Medicine

## 2021-08-02 NOTE — Telephone Encounter (Signed)
Pt called to reschedule her appt for today that we cancelled. Call back at [phone]

## 2021-08-14 ENCOUNTER — Other Ambulatory Visit: Payer: Self-pay

## 2021-08-14 ENCOUNTER — Inpatient Hospital Stay: Payer: BC Managed Care – PPO | Attending: Internal Medicine | Admitting: Internal Medicine

## 2021-08-14 DIAGNOSIS — G8929 Other chronic pain: Secondary | ICD-10-CM | POA: Diagnosis not present

## 2021-08-14 DIAGNOSIS — Z79899 Other long term (current) drug therapy: Secondary | ICD-10-CM | POA: Diagnosis not present

## 2021-08-14 DIAGNOSIS — M2550 Pain in unspecified joint: Secondary | ICD-10-CM | POA: Diagnosis not present

## 2021-08-14 DIAGNOSIS — D649 Anemia, unspecified: Secondary | ICD-10-CM | POA: Diagnosis not present

## 2021-08-14 DIAGNOSIS — Z87891 Personal history of nicotine dependence: Secondary | ICD-10-CM | POA: Insufficient documentation

## 2021-08-14 DIAGNOSIS — I447 Left bundle-branch block, unspecified: Secondary | ICD-10-CM | POA: Insufficient documentation

## 2021-08-14 DIAGNOSIS — R5382 Chronic fatigue, unspecified: Secondary | ICD-10-CM | POA: Diagnosis not present

## 2021-08-14 DIAGNOSIS — Z170 Estrogen receptor positive status [ER+]: Secondary | ICD-10-CM | POA: Insufficient documentation

## 2021-08-14 DIAGNOSIS — C50411 Malignant neoplasm of upper-outer quadrant of right female breast: Secondary | ICD-10-CM | POA: Diagnosis not present

## 2021-08-14 DIAGNOSIS — M810 Age-related osteoporosis without current pathological fracture: Secondary | ICD-10-CM | POA: Diagnosis not present

## 2021-08-14 DIAGNOSIS — E785 Hyperlipidemia, unspecified: Secondary | ICD-10-CM | POA: Diagnosis not present

## 2021-08-14 DIAGNOSIS — I10 Essential (primary) hypertension: Secondary | ICD-10-CM | POA: Diagnosis not present

## 2021-08-14 DIAGNOSIS — E669 Obesity, unspecified: Secondary | ICD-10-CM | POA: Insufficient documentation

## 2021-08-14 DIAGNOSIS — Z87442 Personal history of urinary calculi: Secondary | ICD-10-CM | POA: Insufficient documentation

## 2021-08-14 NOTE — Progress Notes (Signed)
[facility] NOTE  Patient Care Team: [name], MD as PCP - General (Family Medicine) [name], MD (Family Medicine) Bary Castilla, Forest Gleason, MD (General Surgery)  CHIEF COMPLAINTS/PURPOSE OF CONSULTATION: Breast cancer  #  Oncology History Overview Note  Targeted ultrasound of the right breast at the 10 o'clock position 5 cm from the nipple demonstrates 0.3 x 0.5 x 0.4 cm irregular hypoechoic mass with indistinct and angular margins, corresponding to the mammographic finding. Internal vascularity is noted.   Targeted ultrasound of the right axilla demonstrates lymph nodes with normal morphology.   IMPRESSION: 1. Suspicious 0.5 cm mass at the right breast 10 o'clock position. Recommend ultrasound-guided biopsy. 2. No right axillary lymphadenopathy.  DIAGNOSIS:  A. BREAST, RIGHT AT 10:00, 5 CM FROM THE NIPPLE; ULTRASOUND-GUIDED CORE  NEEDLE BIOPSY:  - INVASIVE MAMMARY CARCINOMA, NO SPECIAL TYPE.   Size of invasive carcinoma: 6 mm in this sample  Histologic grade of invasive carcinoma: Grade 2                       Glandular/tubular differentiation score: 3                       Nuclear pleomorphism score: 2                       Mitotic rate score: 1                       Total score: 6  Ductal carcinoma in situ: Present  Lymphovascular invasion: Not identified   ER/PR/HER2: Immunohistochemistry will be performed on block A1, with  reflex to [place] for HER2 2+. The results will be reported in an addendum.   GROSS DESCRIPTION:  A. Labeled: Right breast 10:00 5 cm from nipple  Received: Formalin  Time/date in fixative: Collected and placed in formalin at 4:20 PM on  05/15/2021  Cold ischemic time: Less than 1 minute  Total fixation time: Approximately 22 hours  Core pieces: 5 cores  Size: Range from 1-1.4 cm in length and 0.2 cm in diameter  Description: Received are cores of yellow fibrofatty tissue.  Ink color: Blue  Entirely submitted  in cassettes 1-2 with 3 cores in cassette 1 and 2  cores in cassette 2.   RB 05/15/2021   Final Diagnosis performed by Allena Napoleon, MD.   Electronically signed  05/16/2021 10:42:07AM  The electronic signature indicates that the named Attending Pathologist  has evaluated the specimen  Technical component performed at [facility], [address] [place],  [place] [address] Lab: [phone] Dir: Rush Farmer, MD, MMM   Professional component performed at [facility], [facility] [place], [place], [place], [place] [address] Lab: [phone]  Dir: Kathi Simpers, MD   ADDENDUM:  CASE SUMMARY: BREAST BIOMARKER TESTS  Estrogen Receptor (ER) Status: POSITIVE          Percentage of cells with nuclear positivity: >90%          Average intensity of staining: Strong   Progesterone Receptor (PgR) Status: POSITIVE          Percentage of cells with nuclear positivity: >90%          Average intensity of staining: Strong   HER2 (by immunohistochemistry): NEGATIVE (Score 1+)   Ki-67: Not performed   Cold Ischemia and Fixation Times: Meet requirements specified in latest  version of the ASCO/CAP guidelines  Testing Performed on Block Number(s): A1   METHODS  Fixative: Formalin  Estrogen Receptor:  FDA cleared ([place]) Primary Antibody:  SP1  Progesterone Receptor: FDA cleared ([place]) Primary Antibody: 1E2  HER2 (by IHC): FDA approved ([place]) Primary Antibody: 4B5 ([facility])  Immunohistochemistry controls worked appropriately. Slides were prepared  by [facility], [place], [place], and interpreted by Allena Napoleon,  MD.  (v1.5.0.0)   PATHOLOGIC STAGE CLASSIFICATION (pTNM, AJCC 8th Edition):  TNM Descriptors: Not applicable  pT Category: pT1b  Regional Lymph Nodes Modifier: sn  pN Category: pN0  pM Category: Not applicable   SPECIAL STUDIES  Breast Biomarker Testing Performed on Previous Biopsy: [id number]  Estrogen Receptor (ER) Status: POSITIVE           Percentage of cells with nuclear positivity: >90%          Average intensity of staining: Strong   Progesterone Receptor (PgR) Status: POSITIVE          Percentage of cells with nuclear positivity: >90%          Average intensity of staining: Strong   HER2 (by immunohistochemistry): NEGATIVE (Score 1+)   (v4.7.0.1)   S/p DIAGNOSIS:  A. BREAST, RIGHT, ADDITIONAL MEDIAL MARGIN; EXCISION:  - BENIGN MAMMARY PARENCHYMA WITH FIBROSIS/SCAR, FAT NECROSIS, AND  FOREIGN BODY TYPE GIANT CELL REACTION, COMPATIBLE WITH PRIOR PROCEDURE.  - FOCAL FIBROADENOMATOID CHANGES AND DYSTROPHIC CALCIFICATIONS.  - NEGATIVE FOR RESIDUAL INVASIVE CARCINOMA.  - FINAL MARGINS NEGATIVE FOR DCIS AND MALIGNANCY.    Carcinoma of upper-outer quadrant of right breast in female, estrogen receptor positive ([place])  06/05/2021 Initial Diagnosis   Carcinoma of upper-outer quadrant of right breast in female, estrogen receptor positive ([place])   08/14/2021 Cancer Staging   Staging form: Breast, AJCC 8th Edition - Pathologic: Stage IA (pT1b, pN0, cM0, G2, ER+, PR+, HER2-) - Signed by Cammie Sickle, MD on 08/14/2021 Histologic grading system: 3 grade system       HISTORY OF PRESENTING ILLNESS: Obese.  Ambulating independently.  Alone. Julia Sandoval 76 y.o.  female with a history of early stage breast cancer ER/PR positive is here for follow-up.  Patient had surgery December 2022-however was recommended to follow-up.  Patient had a repeat resection/lumpectomy for a positive margin positive for DCIS.  Final margins negative.  Patient however felt "everything was clear"-did not want to follow-up.  However after multiple phone calls-patient decided to make appointment.  Patient has chronic joint pain/sciatica.  Not any worse.  Chronic fatigue.  Review of Systems  Constitutional:  Positive for malaise/fatigue. Negative for chills, diaphoresis, fever and weight loss.  HENT:  Negative for nosebleeds and sore throat.    Eyes:  Negative for double vision.  Respiratory:  Negative for cough, hemoptysis, sputum production, shortness of breath and wheezing.   Cardiovascular:  Negative for chest pain, palpitations, orthopnea and leg swelling.  Gastrointestinal:  Negative for abdominal pain, blood in stool, constipation, diarrhea, heartburn, melena, nausea and vomiting.  Genitourinary:  Negative for dysuria, frequency and urgency.  Musculoskeletal:  Positive for back pain, joint pain and myalgias.  Skin: Negative.  Negative for itching and rash.  Neurological:  Negative for dizziness, tingling, focal weakness, weakness and headaches.  Endo/Heme/Allergies:  Does not bruise/bleed easily.  Psychiatric/Behavioral:  Negative for depression. The patient is not nervous/anxious and does not have insomnia.     MEDICAL HISTORY:  Past Medical History:  Diagnosis Date   Depression    Hyperlipidemia    Mixed   Hypertension  Unspecified   Kidney stone    LBBB (left bundle branch block)    Obesity     SURGICAL HISTORY: Past Surgical History:  Procedure Laterality Date   ANKLE FRACTURE SURGERY     BREAST BIOPSY Right 05/14/2021   [place] bx, ribbon marker, INVASIVE MAMMARY CARCINOMA   BREAST EXCISIONAL BIOPSY Right 2013   neg   BREAST LUMPECTOMY,RADIO FREQ LOCALIZER,AXILLARY SENTINEL LYMPH NODE BIOPSY Right 06/13/2021   Procedure: BREAST LUMPECTOMY,RADIO FREQ LOCALIZER,AXILLARY SENTINEL LYMPH NODE BIOPSY;  Surgeon: Benjamine Sprague, DO;  Location: [facility];  Service: General;  Laterality: Right;   BREAST SURGERY Right 07/04/2013   Stereotactic biopsy for asymmetric density and calcifications: Flat epithelial atypia, intraductal papilloma, fragmented.   BREAST SURGERY Right 09/06/2013   Right breast wide excision, ADH only   CESAREAN SECTION     CHOLECYSTECTOMY     COLONOSCOPY  2013   COLONOSCOPY Right 06/27/2021   Procedure: COLONOSCOPY;  Surgeon: Benjamine Sprague, DO;  Location: [facility];  Service: General;  Laterality:  Right;   COLONOSCOPY WITH PROPOFOL N/A 06/27/2021   Procedure: COLONOSCOPY WITH PROPOFOL;  Surgeon: Benjamine Sprague, DO;  Location: [facility];  Service: General;  Laterality: N/A;  TRAVEL CASE AT 2 PM, COLONOSCOPY 1ST   CYSTOSCOPY W/ RETROGRADES N/A 03/31/2016   Procedure: CYSTOSCOPY WITH RETROGRADE PYELOGRAM;  Surgeon: Cleon Gustin, MD;  Location: [facility];  Service: Urology;  Laterality: N/A;   CYSTOSCOPY W/ URETERAL STENT PLACEMENT Left 04/28/2016   Procedure: CYSTOSCOPY WITH STENT REPLACEMENT;  Surgeon: Hollice Espy, MD;  Location: [facility];  Service: Urology;  Laterality: Left;   CYSTOSCOPY WITH STENT PLACEMENT Left 03/31/2016   Procedure: CYSTOSCOPY WITH STENT PLACEMENT;  Surgeon: Cleon Gustin, MD;  Location: [facility];  Service: Urology;  Laterality: Left;   DILATION AND CURETTAGE OF UTERUS  2015   GASTRIC BYPASS  2012   RE-EXCISION OF BREAST LUMPECTOMY Right 06/27/2021   Procedure: RE-EXCISION OF BREAST LUMPECTOMY;  Surgeon: Benjamine Sprague, DO;  Location: [facility];  Service: General;  Laterality: Right;   SMALL INTESTINE SURGERY  07/2015   Accenditial cut-[facility]   TONSILLECTOMY     URETEROSCOPY WITH HOLMIUM LASER LITHOTRIPSY Left 04/28/2016   Procedure: URETEROSCOPY WITH HOLMIUM LASER LITHOTRIPSY;  Surgeon: Hollice Espy, MD;  Location: [facility];  Service: Urology;  Laterality: Left;   VENTRAL HERNIA REPAIR N/A 01/25/2016   Procedure: HERNIA REPAIR VENTRAL ADULT;  Surgeon: Leonie Green, MD;  Location: [facility];  Service: General;  Laterality: N/A;   VENTRAL HERNIA REPAIR N/A 06/20/2016   Procedure: HERNIA REPAIR VENTRAL ADULT;  Surgeon: Leonie Green, MD;  Location: [facility];  Service: General;  Laterality: N/A;    SOCIAL HISTORY: Social History   Socioeconomic History   Marital status: Married    Spouse name: Evette Doffing   Number of children: Not on file   Years of education: Not on file   Highest education level: Not on file  Occupational History    Occupation: Full time  Tobacco Use   Smoking status: Former    Packs/day: 0.50    Years: 10.00    Pack years: 5.00    Types: Cigarettes    Quit date: 07/07/1998    Years since quitting: 23.1   Smokeless tobacco: Never  Vaping Use   Vaping Use: Never used  Substance and Sexual Activity   Alcohol use: No   Drug use: Not Currently    Types: Marijuana    Comment: last used 2021   Sexual activity:  Yes  Other Topics Concern   Not on file  Social History Narrative   Gets regular exercise; lives in [place]; with husband; custody of the three grandkids [son died 41 years]; remote hx of smoking; no alcohol. 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  MEDICATIONS:  Current Outpatient Medications  Medication Sig Dispense Refill   ASHWAGANDHA PO Take 2 capsules by mouth daily.     buPROPion (WELLBUTRIN XL) 300 MG 24 hr tablet Take 300 mg by mouth every morning.     Calcium Carbonate (CALCIUM 500 PO) Take 2 tablets by mouth daily.     Cholecalciferol (VITAMIN D3) 50 MCG (2000 UT) capsule Take 4,000 Units by mouth daily.     Coenzyme Q10 (COQ10) 100 MG CAPS Take 200 mg by mouth daily.     ibandronate (BONIVA) 150 MG tablet Take 150 mg by mouth every 30 (thirty) days.     ibuprofen (ADVIL) 800 MG tablet Take 1 tablet (800 mg total) by mouth every 8 (eight) hours as needed for mild pain or moderate pain. 30 tablet 0   Misc Natural Products (GLUCOSAMINE CHOND  COMPLEX/MSM PO) Take 1 tablet by mouth in the morning, at noon, and at bedtime.     modafinil (PROVIGIL) 200 MG tablet Take 200 mg by mouth daily as needed (Alertness).     Multiple Vitamin (MULTIVITAMIN) tablet Take 2 tablets by mouth every morning. Gummies     Omega-3 Fatty Acids (FISH OIL PO) Take 3 each by mouth daily. 450 mg per gummy     Probiotic Product (PROBIOTIC-10 PO) Take 1 capsule by mouth 2 (two) times daily.      QUEtiapine (SEROQUEL) 200 MG tablet Take 200 mg by mouth at bedtime.     TURMERIC CURCUMIN PO Take 2 capsules by mouth daily.     vitamin B-12 (CYANOCOBALAMIN) 1000 MCG tablet Take 2,000 mcg by mouth daily.     vortioxetine HBr (TRINTELLIX) 20 MG TABS tablet Take 20 mg by mouth daily.     ZINC GLUCONATE PO Take 7.5 mg by mouth daily.     HYDROcodone-acetaminophen (NORCO) 5-325 MG tablet Take 1 tablet by mouth every 6 (six) hours as needed for up to 6 doses for moderate pain. (Patient not taking: Reported on 06/20/2021) 6 tablet 0   HYDROcodone-acetaminophen (NORCO) 5-325 MG tablet Take 1 tablet by mouth every 6 (six) hours as needed for up to 6 doses for moderate pain. (Patient not taking: Reported on 08/14/2021) 6 tablet 0   vitamin C (ASCORBIC ACID) 250 MG tablet Take 500 mg by mouth daily. (Patient not taking: Reported on 08/14/2021)     No current facility-administered medications for this visit.      Marland Kitchen  PHYSICAL EXAMINATION: ECOG PERFORMANCE STATUS: 0 - Asymptomatic  Vitals:   08/14/21 1527  BP: (!) 149/72  Pulse: 67  Temp: Marland Kitchen)  97.2 F (36.2 C)  SpO2: 100%   Filed Weights   08/14/21 1527  Weight: 185 lb 3.2 oz (84 kg)    Physical Exam Vitals and nursing note reviewed.  HENT:     Head: Normocephalic and atraumatic.     Mouth/Throat:     Pharynx: Oropharynx is clear.  Eyes:     Extraocular Movements: Extraocular movements intact.     Pupils: Pupils are equal, round, and reactive to light.  Cardiovascular:     Rate and Rhythm: Normal rate and  regular rhythm.  Pulmonary:     Comments: Decreased breath sounds bilaterally.  Abdominal:     Palpations: Abdomen is soft.  Musculoskeletal:        General: Normal range of motion.     Cervical back: Normal range of motion.  Skin:    General: Skin is warm.  Neurological:     General: No focal deficit present.     Mental Status: She is alert and oriented to person, place, and time.  Psychiatric:        Behavior: Behavior normal.        Judgment: Judgment normal.     LABORATORY DATA:  I have reviewed the data as listed Lab Results  Component Value Date   WBC 4.8 06/13/2021   HGB 9.7 (L) 06/13/2021   HCT 32.7 (L) 06/13/2021   MCV 79.8 (L) 06/13/2021   PLT 316 06/13/2021   Recent Labs    06/13/21 0929  NA 138  K 3.9  CL 109  CO2 22  GLUCOSE 89  BUN 15  CREATININE 0.56  CALCIUM 8.9  GFRNONAA >60    RADIOGRAPHIC STUDIES: I have personally reviewed the radiological images as listed and agreed with the findings in the report. No results found.  ASSESSMENT & PLAN:   Carcinoma of upper-outer quadrant of right breast in female, estrogen receptor positive ([place]) # Stage I- ER/PR > 90%; her-2 NEGATIVE; s/p  Lumpectomy x2; clear margins. [Dr.Sakai]; G-2.   #I discussed the role of Oncotype [G-2;T= 72m] to delineate further benefit from adjuvant chemotherapy.  Patient declines. Clinically patient less likely to benefit from chemotherapy.  #I again reviewed the role of radiation in the prevention of local recurrence of breast cancer.  We will defer to Dr. CDonella Stade  Patient in general is reluctant.  #  Discussed the mechanism of action of aromatase inhibitors-with blocking of estrogen to prevent breast cancer.  Also discussed the potential side effects including but not limited to arthralgias hot flashes and increased risk of osteoporosis; vaginal atrophy etc.  Patient interested however needs time to decide.  Patient will call [place] she decides not to proceed with radiation  therapy; and if she is interested in proceeding with antihormone therapy.  # Osteoporosis August 2022-[facility]- [facility];PCP-[T score= -2.8];on boniva. Consider Reclast once a year.      #Microcytic anemia- [Hx of gastric bypass; colonoscopy Jan 2023 [Dr.Sakai-[place]]- negative; positive cologard-recommend taking oral iron.  # Genetics: Low clinical suspicion for genetic syndrome; however discussed regarding genetic referral patient declined.   DISPOSITION: # referral to Dr.Chrystal re: Breast cancer # follow up in 1 months; labs- cbc/cmp; iron studies/ferritin; b12- Dr.B  # 40 minutes face-to-face with the patient discussing the above plan of care; more than 50% of time spent on prognosis/ natural history; counseling and coordination.      All questions were answered. The patient/family knows to call the clinic with any problems, questions or concerns.    GLenetta Quaker  Ann Lions, MD 08/14/2021 4:18 PM

## 2021-08-14 NOTE — Addendum Note (Signed)
Addended by: Vanice Sarah on: 08/14/2021 04:22 PM   Modules accepted: Orders

## 2021-08-14 NOTE — Assessment & Plan Note (Addendum)
#  Stage I- ER/PR > 90%; her-2 NEGATIVE; s/p  Lumpectomy x2; clear margins. [Dr.Sakai]; G-2.   #I discussed the role of Oncotype [G-2;T= 27mm] to delineate further benefit from adjuvant chemotherapy.  Patient declines. Clinically patient less likely to benefit from chemotherapy.  #I again reviewed the role of radiation in the prevention of local recurrence of breast cancer.  We will defer to Dr. Donella Stade.  Patient in general is reluctant.  #  Discussed the mechanism of action of aromatase inhibitors-with blocking of estrogen to prevent breast cancer.  Also discussed the potential side effects including but not limited to arthralgias hot flashes and increased risk of osteoporosis; vaginal atrophy etc.  Patient interested however needs time to decide.  Patient will call us if she decides not to proceed with radiation therapy; and if she is interested in proceeding with antihormone therapy.  # Osteoporosis August 2022-BMD- [facility];PCP-[T score= -2.8];on boniva. Consider Reclast once a year.    #Microcytic anemia- [Hx of gastric bypass; colonoscopy Jan 2023 [Dr.Sakai[facility]]- negative; positive cologard-recommend taking oral iron.  # Genetics: Low clinical suspicion for genetic syndrome; however discussed regarding genetic referral patient declined.   DISPOSITION: # referral to Dr.Chrystal re: Breast cancer # follow up in 1 months; labs- cbc/cmp; iron studies/ferritin; b12- Dr.B  # 40 minutes face-to-face with the patient discussing the above plan of care; more than 50% of time spent on prognosis/ natural history; counseling and coordination.

## 2021-08-16 ENCOUNTER — Encounter: Payer: Self-pay | Admitting: *Deleted

## 2021-08-22 ENCOUNTER — Ambulatory Visit: Payer: BC Managed Care – PPO | Admitting: Radiation Oncology

## 2021-08-22 ENCOUNTER — Institutional Professional Consult (permissible substitution): Payer: BC Managed Care – PPO | Admitting: Radiation Oncology

## 2021-08-29 ENCOUNTER — Ambulatory Visit: Payer: BC Managed Care – PPO | Admitting: Radiation Oncology

## 2021-09-04 ENCOUNTER — Other Ambulatory Visit: Payer: Self-pay | Admitting: *Deleted

## 2021-09-04 DIAGNOSIS — C50411 Malignant neoplasm of upper-outer quadrant of right female breast: Secondary | ICD-10-CM

## 2021-09-09 ENCOUNTER — Other Ambulatory Visit: Payer: Self-pay | Admitting: Podiatry

## 2021-09-11 ENCOUNTER — Telehealth: Payer: Self-pay | Admitting: *Deleted

## 2021-09-11 ENCOUNTER — Inpatient Hospital Stay: Payer: BC Managed Care – PPO

## 2021-09-11 ENCOUNTER — Inpatient Hospital Stay: Payer: BC Managed Care – PPO | Admitting: Internal Medicine

## 2021-09-11 NOTE — Telephone Encounter (Signed)
Patient wishes to cancel today's appointment. ?

## 2022-01-03 ENCOUNTER — Other Ambulatory Visit: Payer: Self-pay | Admitting: Podiatry

## 2022-10-24 ENCOUNTER — Emergency Department: Payer: BC Managed Care – PPO

## 2022-10-24 ENCOUNTER — Emergency Department
Admission: EM | Admit: 2022-10-24 | Discharge: 2022-10-24 | Disposition: A | Discharge status: Home / Self Care | Payer: BC Managed Care – PPO | Attending: Emergency Medicine | Admitting: Emergency Medicine

## 2022-10-24 ENCOUNTER — Other Ambulatory Visit: Payer: Self-pay

## 2022-10-24 DIAGNOSIS — R079 Chest pain, unspecified: Secondary | ICD-10-CM | POA: Diagnosis present

## 2022-10-24 DIAGNOSIS — K219 Gastro-esophageal reflux disease without esophagitis: Secondary | ICD-10-CM | POA: Insufficient documentation

## 2022-10-24 DIAGNOSIS — R0789 Other chest pain: Secondary | ICD-10-CM

## 2022-10-24 DIAGNOSIS — I10 Essential (primary) hypertension: Secondary | ICD-10-CM | POA: Insufficient documentation

## 2022-10-24 DIAGNOSIS — Z20822 Contact with and (suspected) exposure to covid-19: Secondary | ICD-10-CM | POA: Insufficient documentation

## 2022-10-24 LAB — HEPATIC FUNCTION PANEL
ALT: 37 U/L (ref 0–44)
AST: 55 U/L — ABNORMAL HIGH (ref 15–41)
Albumin: 3.2 g/dL — ABNORMAL LOW (ref 3.5–5.0)
Alkaline Phosphatase: 66 U/L (ref 38–126)
Bilirubin, Direct: 0.2 mg/dL (ref 0.0–0.2)
Indirect Bilirubin: 0.8 mg/dL (ref 0.3–0.9)
Total Bilirubin: 1 mg/dL (ref 0.3–1.2)
Total Protein: 6.9 g/dL (ref 6.5–8.1)

## 2022-10-24 LAB — BASIC METABOLIC PANEL
Anion gap: 8 (ref 5–15)
BUN: 19 mg/dL (ref 8–23)
CO2: 24 mmol/L (ref 22–32)
Calcium: 9.2 mg/dL (ref 8.9–10.3)
Chloride: 103 mmol/L (ref 98–111)
Creatinine, Ser: 1.03 mg/dL — ABNORMAL HIGH (ref 0.44–1.00)
GFR, Estimated: 56 mL/min — ABNORMAL LOW (ref >60)
Glucose, Bld: 114 mg/dL — ABNORMAL HIGH (ref 70–99)
Potassium: 3.8 mmol/L (ref 3.5–5.1)
Sodium: 135 mmol/L (ref 135–145)

## 2022-10-24 LAB — TROPONIN I (HIGH SENSITIVITY): Troponin I (High Sensitivity): 17 ng/L (ref <18)

## 2022-10-24 LAB — CBC
HCT: 40.1 % (ref 36.0–46.0)
Hemoglobin: 12 g/dL (ref 12.0–15.0)
MCH: 25.2 pg — ABNORMAL LOW (ref 26.0–34.0)
MCHC: 29.9 g/dL — ABNORMAL LOW (ref 30.0–36.0)
MCV: 84.1 fL (ref 80.0–100.0)
Platelets: 226 10*3/uL (ref 150–400)
RBC: 4.77 MIL/uL (ref 3.87–5.11)
RDW: 14.7 % (ref 11.5–15.5)
WBC: 14 10*3/uL — ABNORMAL HIGH (ref 4.0–10.5)
nRBC: 0 % (ref 0.0–0.2)

## 2022-10-24 LAB — LIPASE, BLOOD: Lipase: 28 U/L (ref 11–51)

## 2022-10-24 LAB — SARS CORONAVIRUS 2 BY RT PCR: SARS Coronavirus 2 by RT PCR: NEGATIVE

## 2022-10-24 MED ORDER — ALUM & MAG HYDROXIDE-SIMETH 200-200-20 MG/5ML PO SUSP
30.0000 mL | Freq: Once | ORAL | Status: AC
  Administered 2022-10-24: 30 mL via ORAL
  Filled 2022-10-24: qty 30

## 2022-10-24 MED ORDER — METOCLOPRAMIDE HCL 10 MG PO TABS
10.0000 mg | ORAL_TABLET | Freq: Once | ORAL | Status: AC
  Administered 2022-10-24: 10 mg via ORAL
  Filled 2022-10-24: qty 1

## 2022-10-24 MED ORDER — FAMOTIDINE 20 MG PO TABS: 20.0000 mg | ORAL_TABLET | Freq: Two times a day (BID) | ORAL | 0 refills | Status: DC

## 2022-10-24 MED ORDER — ALUMINUM-MAGNESIUM-SIMETHICONE 200-200-20 MG/5ML PO SUSP: 30.0000 mL | Freq: Three times a day (TID) | ORAL | 0 refills | Status: DC

## 2022-10-24 MED ORDER — FAMOTIDINE 20 MG PO TABS
40.0000 mg | ORAL_TABLET | Freq: Once | ORAL | Status: AC
  Administered 2022-10-24: 40 mg via ORAL
  Filled 2022-10-24: qty 2

## 2022-10-24 NOTE — ED Triage Notes (Signed)
Pt to ED via POV from home. Pt reports left sided CP that started last night. Pain has been constant. Pt denies cardiac hx. Pt reports possible flu.

## 2022-10-24 NOTE — ED Provider Notes (Signed)
[facility] Provider Note    Event Date/Time   First MD Initiated Contact with Patient 10/24/22 1241     (approximate)   History   Chief Complaint: Chest Pain   HPI  Julia Sandoval is a 77 y.o. female with a history of hypertension who comes to the ED complaining of chest pain that started a few days ago, associated with chills, fatigue.  No shortness of breath or cough.  No fever.  Pain is nonradiating.  She indicates the anterior sternum and inferior rib margins as the location of the pain.  No aggravating or alleviating factors.  Not exertional, not pleuritic.  No vomiting or diaphoresis.  Normal oral intake.     Physical Exam   Triage Vital Signs: ED Triage Vitals [10/24/22 1122]  Enc Vitals Group     BP 112/75     Pulse Rate 83     Resp 18     Temp 98 F (36.7 C)     Temp Source Oral     SpO2 98 %     Weight      Height      Head Circumference      Peak Flow      Pain Score 6     Pain Loc      Pain Edu?      Excl. in GC?     Most recent vital signs: Vitals:   10/24/22 1122  BP: 112/75  Pulse: 83  Resp: 18  Temp: 98 F (36.7 C)  SpO2: 98%    General: Awake, no distress.  CV:  Good peripheral perfusion.  Regular rate and rhythm.  Normal distal pulse. Resp:  Normal effort.  Clear to auscultation bilaterally Abd:  No distention.  Soft with mild epigastric tenderness Other:  No lower extremity edema.  Moist oral mucosa.   ED Results / Procedures / Treatments   Labs (all labs ordered are listed, but only abnormal results are displayed) Labs Reviewed  BASIC METABOLIC PANEL - Abnormal; Notable for the following components:      Result Value   Glucose, Bld 114 (*)    Creatinine, Ser 1.03 (*)    GFR, Estimated 56 (*)    All other components within normal limits  CBC - Abnormal; Notable for the following components:   WBC 14.0 (*)    MCH 25.2 (*)    MCHC 29.9 (*)    All other components within normal limits  HEPATIC  FUNCTION PANEL - Abnormal; Notable for the following components:   Albumin 3.2 (*)    AST 55 (*)    All other components within normal limits  SARS CORONAVIRUS 2 BY RT PCR  LIPASE, BLOOD  TROPONIN I (HIGH SENSITIVITY)     EKG Interpreted by me Sinus rhythm rate of 81, left axis, left bundle branch block.  No acute ischemic changes.   RADIOLOGY Chest x-ray interpreted by me, appears normal.  Radiology report reviewed   PROCEDURES:  Procedures   MEDICATIONS ORDERED IN ED: Medications  alum & mag hydroxide-simeth (MAALOX/MYLANTA) 200-200-20 MG/5ML suspension 30 mL (30 mLs Oral Given 10/24/22 1318)  famotidine (PEPCID) tablet 40 mg (40 mg Oral Given 10/24/22 1318)  metoCLOPramide (REGLAN) tablet 10 mg (10 mg Oral Given 10/24/22 1318)     IMPRESSION / MDM / ASSESSMENT AND PLAN / ED COURSE  I reviewed the triage vital signs and the nursing notes.  DDx: Gastritis, pancreatitis, COVID, other viral illness, non-STEMI, AKI.  Doubt biliary disease, bowel obstruction, GI perforation.  Patient's presentation is most consistent with acute presentation with potential threat to life or bodily function.  Patient presents with chest pain which appears to be originating from her epigastrium with tenderness.  Vital signs are normal, EKG nonischemic.  Chest x-ray unremarkable.  Initial serum labs are unremarkable except for mild leukocytosis.  Most likely this is a viral illness.  Will check lipase and hepatic function panel to exclude biliary pathology.  Symptoms are atypical and ongoing for the last 2 days.  Single high-sensitivity troponin which is normal rules out MI. No further cardiac workup needed.  ----------------------------------------- 3:21 PM on 10/24/2022 ----------------------------------------- Rest of the workup is unremarkable.  Feeling better.  Stable for discharge      FINAL CLINICAL IMPRESSION(S) / ED DIAGNOSES   Final diagnoses:  Atypical chest pain   Gastroesophageal reflux disease, unspecified whether esophagitis present     Rx / DC Orders   ED Discharge Orders          Ordered    aluminum-magnesium hydroxide-simethicone (MAALOX) 200-200-20 MG/5ML SUSP  3 times daily before meals & bedtime        10/24/22 1520    famotidine (PEPCID) 20 MG tablet  2 times daily        10/24/22 1520             Note:  This document was prepared using Dragon voice recognition software and may include unintentional dictation errors.   Sharman Cheek, MD 10/24/22 [phone]

## 2022-10-26 ENCOUNTER — Emergency Department
Admission: EM | Admit: 2022-10-26 | Discharge: 2022-10-26 | Disposition: A | Discharge status: Home / Self Care | Payer: BC Managed Care – PPO | Attending: Emergency Medicine | Admitting: Emergency Medicine

## 2022-10-26 ENCOUNTER — Other Ambulatory Visit: Payer: Self-pay

## 2022-10-26 ENCOUNTER — Emergency Department: Payer: BC Managed Care – PPO

## 2022-10-26 DIAGNOSIS — R197 Diarrhea, unspecified: Secondary | ICD-10-CM | POA: Insufficient documentation

## 2022-10-26 DIAGNOSIS — E860 Dehydration: Secondary | ICD-10-CM | POA: Diagnosis not present

## 2022-10-26 DIAGNOSIS — E876 Hypokalemia: Secondary | ICD-10-CM | POA: Insufficient documentation

## 2022-10-26 DIAGNOSIS — M79605 Pain in left leg: Secondary | ICD-10-CM | POA: Diagnosis present

## 2022-10-26 LAB — COMPREHENSIVE METABOLIC PANEL
ALT: 28 U/L (ref 0–44)
AST: 38 U/L (ref 15–41)
Albumin: 2.7 g/dL — ABNORMAL LOW (ref 3.5–5.0)
Alkaline Phosphatase: 77 U/L (ref 38–126)
Anion gap: 13 (ref 5–15)
BUN: 28 mg/dL — ABNORMAL HIGH (ref 8–23)
CO2: 20 mmol/L — ABNORMAL LOW (ref 22–32)
Calcium: 8.4 mg/dL — ABNORMAL LOW (ref 8.9–10.3)
Chloride: 96 mmol/L — ABNORMAL LOW (ref 98–111)
Creatinine, Ser: 1.36 mg/dL — ABNORMAL HIGH (ref 0.44–1.00)
GFR, Estimated: 40 mL/min — ABNORMAL LOW (ref >60)
Glucose, Bld: 115 mg/dL — ABNORMAL HIGH (ref 70–99)
Potassium: 3.1 mmol/L — ABNORMAL LOW (ref 3.5–5.1)
Sodium: 129 mmol/L — ABNORMAL LOW (ref 135–145)
Total Bilirubin: 1.1 mg/dL (ref 0.3–1.2)
Total Protein: 6.2 g/dL — ABNORMAL LOW (ref 6.5–8.1)

## 2022-10-26 LAB — CBC WITH DIFFERENTIAL/PLATELET
Abs Immature Granulocytes: 0.19 10*3/uL — ABNORMAL HIGH (ref 0.00–0.07)
Basophils Absolute: 0 10*3/uL (ref 0.0–0.1)
Basophils Relative: 0 %
Eosinophils Absolute: 0 10*3/uL (ref 0.0–0.5)
Eosinophils Relative: 0 %
HCT: 34.4 % — ABNORMAL LOW (ref 36.0–46.0)
Hemoglobin: 10.6 g/dL — ABNORMAL LOW (ref 12.0–15.0)
Immature Granulocytes: 2 %
Lymphocytes Relative: 1 %
Lymphs Abs: 0.2 10*3/uL — ABNORMAL LOW (ref 0.7–4.0)
MCH: 25.2 pg — ABNORMAL LOW (ref 26.0–34.0)
MCHC: 30.8 g/dL (ref 30.0–36.0)
MCV: 81.9 fL (ref 80.0–100.0)
Monocytes Absolute: 0.6 10*3/uL (ref 0.1–1.0)
Monocytes Relative: 4 %
Neutro Abs: 11.9 10*3/uL — ABNORMAL HIGH (ref 1.7–7.7)
Neutrophils Relative %: 93 %
Platelets: 200 10*3/uL (ref 150–400)
RBC: 4.2 MIL/uL (ref 3.87–5.11)
RDW: 14.7 % (ref 11.5–15.5)
Smear Review: NORMAL
WBC: 12.9 10*3/uL — ABNORMAL HIGH (ref 4.0–10.5)
nRBC: 0 % (ref 0.0–0.2)

## 2022-10-26 LAB — BRAIN NATRIURETIC PEPTIDE: B Natriuretic Peptide: 237.3 pg/mL — ABNORMAL HIGH (ref 0.0–100.0)

## 2022-10-26 MED ORDER — KETOROLAC TROMETHAMINE 30 MG/ML IJ SOLN
15.0000 mg | Freq: Once | INTRAMUSCULAR | Status: AC
  Administered 2022-10-26: 15 mg via INTRAVENOUS
  Filled 2022-10-26: qty 1

## 2022-10-26 MED ORDER — SODIUM CHLORIDE 0.9 % IV BOLUS
1000.0000 mL | Freq: Once | INTRAVENOUS | Status: AC
  Administered 2022-10-26: 1000 mL via INTRAVENOUS

## 2022-10-26 MED ORDER — POTASSIUM CHLORIDE 20 MEQ PO PACK
40.0000 meq | PACK | Freq: Every day | ORAL | Status: DC
  Administered 2022-10-26: 40 meq via ORAL
  Filled 2022-10-26: qty 2

## 2022-10-26 MED ORDER — KETOROLAC TROMETHAMINE 30 MG/ML IJ SOLN: 30.0000 mg | Freq: Once | INTRAMUSCULAR | Status: DC

## 2022-10-26 MED ORDER — POTASSIUM CHLORIDE 10 MEQ/100ML IV SOLN
10.0000 meq | Freq: Once | INTRAVENOUS | Status: AC
  Administered 2022-10-26: 10 meq via INTRAVENOUS
  Filled 2022-10-26: qty 100

## 2022-10-26 NOTE — ED Provider Notes (Signed)
[facility] Provider Note   Event Date/Time   First MD Initiated Contact with Patient 10/26/22 [phone]     (approximate) History  Leg Pain  HPI Julia Sandoval is a 77 y.o. female with a stated past medical history of multiple psychological comorbidities who presents complaining of left lower extremity calf pain that extends up to the posterior knee and began last night.  Patient states that she has tried multiple medications including a TENS unit, Alka-Seltzer plus, and Tylenol with no relief of her pain.  Patient denies any history of blood clots.  Patient denies any history of similar pain in the past.  Patient denies any recent trauma.  Patient does arrive via EMS who noted patient to have had diarrhea today as well. ROS: Patient currently denies any vision changes, tinnitus, difficulty speaking, facial droop, sore throat, chest pain, shortness of breath, abdominal pain, nausea/vomiting/diarrhea, dysuria, or weakness/numbness/paresthesias in any extremity   Physical Exam  Triage Vital Signs: ED Triage Vitals  Enc Vitals Group     BP 10/26/22 0711 110/62     Pulse Rate 10/26/22 0711 83     Resp 10/26/22 0711 20     Temp 10/26/22 0711 97.6 F (36.4 C)     Temp Source 10/26/22 0711 Oral     SpO2 10/26/22 0711 98 %     Weight --      Height --      Head Circumference --      Peak Flow --      Pain Score 10/26/22 0710 10     Pain Loc --      Pain Edu? --      Excl. in GC? --    Most recent vital signs: Vitals:   10/26/22 0711 10/26/22 0816  BP: 110/62 95/67  Pulse: 83 88  Resp: 20 17  Temp: 97.6 F (36.4 C)   SpO2: 98% 99%   General: Awake, oriented x4. CV:  Good peripheral perfusion.  Resp:  Normal effort.  Abd:  No distention.  Other:  Elderly who obese Caucasian female laying in bed in no acute distress.  Tenderness palpation of the left calf as well as decreased range of motion at the right knee secondary to pain ED Results / Procedures /  Treatments  Labs (all labs ordered are listed, but only abnormal results are displayed) Labs Reviewed  COMPREHENSIVE METABOLIC PANEL - Abnormal; Notable for the following components:      Result Value   Sodium 129 (*)    Potassium 3.1 (*)    Chloride 96 (*)    CO2 20 (*)    Glucose, Bld 115 (*)    BUN 28 (*)    Creatinine, Ser 1.36 (*)    Calcium 8.4 (*)    Total Protein 6.2 (*)    Albumin 2.7 (*)    GFR, Estimated 40 (*)    All other components within normal limits  CBC WITH DIFFERENTIAL/PLATELET - Abnormal; Notable for the following components:   WBC 12.9 (*)    Hemoglobin 10.6 (*)    HCT 34.4 (*)    MCH 25.2 (*)    Neutro Abs 11.9 (*)    Lymphs Abs 0.2 (*)    Abs Immature Granulocytes 0.19 (*)    All other components within normal limits  BRAIN NATRIURETIC PEPTIDE   EKG ED ECG REPORT I, Merwyn Katos, the attending physician, personally viewed and interpreted this ECG. Date: 10/26/2022 EKG Time: 0716 Rate:  83 Rhythm: normal sinus rhythm QRS Axis: normal Intervals: Left bundle branch block ST/T Wave abnormalities: normal Narrative Interpretation: Normal sinus rhythm with left bundle branch block.  No evidence of acute ischemia RADIOLOGY ED MD interpretation: Doppler ultrasound the left lower extremity interpreted independently by me and shows no evidence of left lower extremity DVT -Agree with radiology assessment Official radiology report(s): US Venous Img Lower Unilateral Left  Result Date: 10/26/2022 CLINICAL DATA:  77 year old female with left lower extremity edema, calf tenderness. EXAM: LEFT LOWER EXTREMITY VENOUS DOPPLER ULTRASOUND TECHNIQUE: Gray-scale sonography with compression, as well as color and duplex ultrasound, were performed to evaluate the deep venous system(s) from the level of the common femoral vein through the popliteal and proximal calf veins. COMPARISON:  None Available. FINDINGS: VENOUS Normal compressibility of the common femoral,  superficial femoral, and popliteal veins, as well as the visualized calf veins. Visualized portions of profunda femoral vein and great saphenous vein unremarkable. No filling defects to suggest DVT on grayscale or color Doppler imaging. Doppler waveforms show normal direction of venous flow, normal respiratory plasticity and response to augmentation. Limited views of the contralateral common femoral vein are unremarkable. OTHER None. Limitations: none IMPRESSION: No evidence of left lower extremity deep venous thrombosis. Electronically Signed   By: Odessa Fleming M.D.   On: 10/26/2022 08:04   PROCEDURES: Critical Care performed: No .1-3 Lead EKG Interpretation  Performed by: Merwyn Katos, MD Authorized by: Merwyn Katos, MD     Interpretation: normal     ECG rate:  71   ECG rate assessment: normal     Rhythm: sinus rhythm     Ectopy: none     Conduction: normal    MEDICATIONS ORDERED IN ED: Medications  potassium chloride 10 mEq in 100 mL IVPB (10 mEq Intravenous New Bag/Given 10/26/22 0809)  potassium chloride (KLOR-CON) packet 40 mEq (40 mEq Oral Given 10/26/22 0808)  sodium chloride 0.9 % bolus 1,000 mL (1,000 mLs Intravenous New Bag/Given 10/26/22 0806)  ketorolac (TORADOL) 30 MG/ML injection 15 mg (15 mg Intravenous Given 10/26/22 0806)   IMPRESSION / MDM / ASSESSMENT AND PLAN / ED COURSE  I reviewed the triage vital signs and the nursing notes.                             The patient is on the cardiac monitor to evaluate for evidence of arrhythmia and/or significant heart rate changes. Patient's presentation is most consistent with acute presentation with potential threat to life or bodily function. This patient presents with lower extremity cramping, generalized weakness and fatigue likely secondary to dehydration. Suspect mild acute kidney injury of prerenal origin. Doubt intrinsic renal dysfunction or obstructive nephropathy. Considered alternate etiologies of the patients symptoms  including infectious processes, severe metabolic derangements or electrolyte abnormalities, ischemia/ACS, heart failure, and intracranial/central processes but think these are unlikely given the history and physical exam.  Plan: labs, 1L fluid resuscitation, pain/nausea control, reassessment  Dispo: Discharge home   FINAL CLINICAL IMPRESSION(S) / ED DIAGNOSES   Final diagnoses:  Dehydration  Hypokalemia  Diarrhea, unspecified type   Rx / DC Orders   ED Discharge Orders     None      Note:  This document was prepared using Dragon voice recognition software and may include unintentional dictation errors.   Merwyn Katos, MD 10/26/22 [phone]

## 2022-10-26 NOTE — ED Triage Notes (Signed)
Pt arrives via EMS from home c/o LLE pain and swelling since yesterday afternoon. Per EMS, pt exhibited exertional dyspnea. Pt reports pain started in her calf and is now at her knee. Pt describes it as constant burning sensation.

## 2022-10-27 ENCOUNTER — Ambulatory Visit: Payer: Self-pay | Admitting: *Deleted

## 2022-10-27 ENCOUNTER — Emergency Department (HOSPITAL_COMMUNITY): Payer: BC Managed Care – PPO

## 2022-10-27 ENCOUNTER — Emergency Department (HOSPITAL_COMMUNITY)
Admission: EM | Admit: 2022-10-27 | Discharge: 2022-11-05 | Disposition: E | Payer: BC Managed Care – PPO | Attending: Emergency Medicine | Admitting: Emergency Medicine

## 2022-10-27 DIAGNOSIS — I10 Essential (primary) hypertension: Secondary | ICD-10-CM | POA: Insufficient documentation

## 2022-10-27 DIAGNOSIS — R464 Slowness and poor responsiveness: Secondary | ICD-10-CM | POA: Diagnosis present

## 2022-10-27 DIAGNOSIS — I469 Cardiac arrest, cause unspecified: Secondary | ICD-10-CM | POA: Insufficient documentation

## 2022-10-27 DIAGNOSIS — R7309 Other abnormal glucose: Secondary | ICD-10-CM | POA: Diagnosis not present

## 2022-10-27 DIAGNOSIS — Z853 Personal history of malignant neoplasm of breast: Secondary | ICD-10-CM | POA: Diagnosis not present

## 2022-10-27 LAB — I-STAT CHEM 8, ED
BUN: 49 mg/dL — ABNORMAL HIGH (ref 8–23)
Calcium, Ion: 0.97 mmol/L — ABNORMAL LOW (ref 1.15–1.40)
Chloride: 98 mmol/L (ref 98–111)
Creatinine, Ser: 2.9 mg/dL — ABNORMAL HIGH (ref 0.44–1.00)
Glucose, Bld: 164 mg/dL — ABNORMAL HIGH (ref 70–99)
HCT: 35 % — ABNORMAL LOW (ref 36.0–46.0)
Hemoglobin: 11.9 g/dL — ABNORMAL LOW (ref 12.0–15.0)
Potassium: 5.7 mmol/L — ABNORMAL HIGH (ref 3.5–5.1)
Sodium: 124 mmol/L — ABNORMAL LOW (ref 135–145)
TCO2: 16 mmol/L — ABNORMAL LOW (ref 22–32)

## 2022-10-27 LAB — CBG MONITORING, ED
Glucose-Capillary: 57 mg/dL — ABNORMAL LOW (ref 70–99)
Glucose-Capillary: 161 mg/dL — ABNORMAL HIGH (ref 70–99)

## 2022-10-27 MED ORDER — ATROPINE SULFATE 1 MG/10ML IJ SOSY
PREFILLED_SYRINGE | INTRAMUSCULAR | Status: AC | PRN
  Administered 2022-10-27: 1 mg via INTRAVENOUS

## 2022-10-27 MED ORDER — PHENYLEPHRINE HCL (PRESSORS) 10 MG/ML IV SOLN
INTRAVENOUS | Status: AC | PRN
  Administered 2022-10-27 (×2): 160 ug

## 2022-10-27 MED ORDER — ETOMIDATE 2 MG/ML IV SOLN
INTRAVENOUS | Status: AC | PRN
  Administered 2022-10-27: 20 mg via INTRAVENOUS

## 2022-10-27 MED ORDER — EPINEPHRINE HCL 5 MG/250ML IV SOLN IN NS
0.5000 ug/min | INTRAVENOUS | Status: DC
  Administered 2022-10-27: 15 ug/min via INTRAVENOUS

## 2022-10-27 MED ORDER — SODIUM BICARBONATE 8.4 % IV SOLN
INTRAVENOUS | Status: AC | PRN
  Administered 2022-10-27: 50 meq via INTRAVENOUS

## 2022-10-27 MED ORDER — NOREPINEPHRINE 4 MG/250ML-% IV SOLN
0.0000 ug/min | INTRAVENOUS | Status: DC
  Administered 2022-10-27: 30 ug/min via INTRAVENOUS

## 2022-10-27 MED ORDER — SUCCINYLCHOLINE CHLORIDE 20 MG/ML IJ SOLN
INTRAMUSCULAR | Status: AC | PRN
  Administered 2022-10-27: 70 mg via INTRAVENOUS

## 2022-10-27 MED ORDER — EPINEPHRINE 1 MG/10ML IJ SOSY
PREFILLED_SYRINGE | INTRAMUSCULAR | Status: AC | PRN
  Administered 2022-10-27: 1 mg via INTRAVENOUS

## 2022-10-27 MED ORDER — SODIUM CHLORIDE 0.9 % IV SOLN
INTRAVENOUS | Status: AC | PRN
  Administered 2022-10-27: 1000 mL via INTRAVENOUS

## 2022-10-27 MED ORDER — ATROPINE SULFATE 1 MG/10ML IJ SOSY
PREFILLED_SYRINGE | INTRAMUSCULAR | Status: AC | PRN
  Administered 2022-10-27 (×2): 1 mg via INTRAVENOUS

## 2022-10-27 MED ORDER — EPINEPHRINE 1 MG/10ML IJ SOSY
PREFILLED_SYRINGE | INTRAMUSCULAR | Status: AC | PRN
  Administered 2022-10-27: .5 mg via INTRAVENOUS

## 2022-10-27 MED ORDER — DEXTROSE 50 % IV SOLN
INTRAVENOUS | Status: AC | PRN
  Administered 2022-10-27: 1 via INTRAVENOUS

## 2022-10-27 MED ORDER — CALCIUM CHLORIDE 10 % IV SOLN
INTRAVENOUS | Status: AC | PRN
  Administered 2022-10-27: 1 g via INTRAVENOUS

## 2022-11-05 NOTE — Code Documentation (Signed)
CBG 57 

## 2022-11-05 NOTE — Code Documentation (Signed)
Pulseless at this time. CPR initiated.

## 2022-11-05 NOTE — Telephone Encounter (Signed)
Husband reporting: Chief Complaint: frequent fall, weakness, confusion Symptoms: confusion, patient unable to walk without assistance Frequency: deteriorating over time- getting worse- husband states he is unable to get patient out of the house without assistance Pertinent Negatives: Patient denies injury in fall- bruising of elbow reported Disposition: ED /[] Urgent Care (no appt availability in office) / Appointment(In office/virtual)/  [facility]/ Home Care/ Refused Recommended Disposition /[] [facility]/  Follow-up with PCP Additional Notes: Patient needs assistance with resources for help- advised husband to reach out to PCP for help. Usually PCP office has access to social workers who can assist in getting in home help and care facility options

## 2022-11-05 NOTE — Code Documentation (Signed)
ICU team at bedside with Dr.Pickering and ED team.

## 2022-11-05 NOTE — Code Documentation (Signed)
Lost pulse at this time. CPR initiated.

## 2022-11-05 NOTE — ED Triage Notes (Signed)
Fell at [facility] and was not alert per family. Fell again at [employer]. Found on the ground by her bed. IO to right leg. EMS unable to get bp as pt is cool to touch.

## 2022-11-05 NOTE — Code Documentation (Signed)
Patient time of death occurred at 69 called by Dr.Pickering.

## 2022-11-05 NOTE — ED Notes (Signed)
Pacing stopped at this time by Dr.Pickering.

## 2022-11-05 NOTE — Code Documentation (Signed)
Pulse palpated in right femoral at this time.

## 2022-11-05 NOTE — Code Documentation (Signed)
Right femoral pulse palpated at this time.  

## 2022-11-05 NOTE — Progress Notes (Signed)
   10/11/2022 1700  Spiritual Encounters  Type of Visit Initial  Care provided to: Family  Referral source Nurse (RN/NT/LPN)  Reason for visit Patient death  OnCall Visit No   Ch responded to page from ED. Pt's family was at bedside. Ch provided hospitality and journeyed with family in process of grief. No follow-up needed at this time.

## 2022-11-05 NOTE — Code Documentation (Signed)
Pulse palpated this time in right femoral.

## 2022-11-05 NOTE — Telephone Encounter (Signed)
Summary: FU pt fell out of bed, husbands wants long term help   Pt was in ER twice this weekend, pt after arriving home from last visit fell out of bed. Pt's husband, [name] states just bruised up arm and side and son helped get her back into bed. Pt states he has health problems and pt can not go to bathroom without assistance, pt states needs to place her somewhere they are not able to handle this situation. Pt wants a nurse to fu with a call as they are in a difficult situation. Pls fu as agent was concerned as she did fall out of bed but caller assures ok. NT lost connection. [phone]         Reason for Disposition  [1] MODERATE weakness (i.e., interferes with work, school, normal activities) AND [2] new-onset or worsening  Answer Assessment - Initial Assessment Questions 1. MECHANISM: "How did the fall happen?"     Patient trying to get up at night, increased confusion 2. DOMESTIC VIOLENCE AND ELDER ABUSE SCREENING: "Did you fall because someone pushed you or tried to hurt you?" If Yes, ask: "Are you safe now?"     no 3. ONSET: "When did the fall happen?" (e.g., minutes, hours, or days ago)     Larey Seat last night 4. LOCATION: "What part of the body hit the ground?" (e.g., back, buttocks, head, hips, knees, hands, head, stomach)     Arm- bruised elbow 5. INJURY: "Did you hurt (injure) yourself when you fell?" If Yes, ask: "What did you injure? Tell me more about this?" (e.g., body area; type of injury; pain severity)"     no 6. PAIN: "Is there any pain?" If Yes, ask: "How bad is the pain?" (e.g., Scale 1-10; or mild,  moderate, severe)   - NONE (0): No pain   - MILD (1-3): Doesn't interfere with normal activities    - MODERATE (4-7): Interferes with normal activities or awakens from sleep    - SEVERE (8-10): Excruciating pain, unable to do any normal activities      Mild/moderate 7. SIZE: For cuts, bruises, or swelling, ask: "How large is it?" (e.g., inches or centimeters)       Bruises- elbow  9. OTHER SYMPTOMS: "Do you have any other symptoms?" (e.g., dizziness, fever, weakness; new onset or worsening).      Worsening confusion 10. CAUSE: "What do you think caused the fall (or falling)?" (e.g., tripped, dizzy spell)       Patient was seen at ED- dehydration, hypokalemia, diarrhea and treated- husband was surprised patient was sent home.  Protocols used: Falls and [facility]

## 2022-11-05 NOTE — Code Documentation (Signed)
Right femoral pulse palpated at this time.

## 2022-11-05 NOTE — Plan of Care (Signed)
PCCM Brief Note  PCCM consulted in the setting of ongoing severe bradycardia, peri-arrest status x 1 hour with intermittent ROSC.  Dr. Vassie Loll and myself presented to bedside to assist Dr. Rubin Payor who had been caring for Ms. Huckaba for the last hour+, coding with intermittent ROSC. On our arrival, patient had multiple RNs/pharmacy/RT at bedside. Patient was diffusely pale and mottled with minimal neurologic response/reflexes. Pulses were thready but being palpated at bilateral femoral sites. Zoll pads in place with attempted TCP, only 1 in 4 beat capture with PR 18-30 and hypotension with MAPs < 60 despite max dose Levophed and Epinephrine. Labs showed K 5.7, which was treated. EKG demonstrated CHB. Cardiology was called by EDP.  We discussed all of the utilized interventions up until this point - patient was unfortunately too unstable for CT to evaluate for CT Head (ICH/ischemic stroke?) or for CTA Chest PE protocol. Empiric TNK administration was discussed, but deemed unsafe to pursue due to patient's unknown clinical status with regard to possible intracranial abnormalities. Despite electrolyte correction, patient remained severely bradycardic and when Zoll pads were removed to determine underlying rhythm, no pulse was palpable. One additional Epi push was given with no response. It was at this point that Dr. Rubin Payor and Dr. Vassie Loll, in agreement, called the code with outcome unfortunately being futile for Ms. Coltrane. TOD was called at 1316.  Tim Lair, PA-C [facility] 10/25/2022 1:35 PM  Please see [email or web address] for pager details.  From 7A-7P if no response, please call [phone] After hours, please call ELink [phone]

## 2022-11-05 NOTE — Code Documentation (Signed)
Lost pulses this time. CPR initiated.

## 2022-11-05 NOTE — ED Provider Notes (Signed)
[place] EMERGENCY DEPARTMENT AT [facility] Provider Note   CSN: [id number] Arrival date & time: 11-Nov-2022  1206     History {Add pertinent medical, surgical, social history, OB history to HPI:1} Chief Complaint  Patient presents with   Julia Sandoval is a 77 y.o. female.  HPI    Past Medical History:  Diagnosis Date   Carcinoma of upper-outer quadrant of right breast in female, estrogen receptor positive    Depression    Hyperlipidemia    Mixed   Hypertension    Unspecified   Kidney stone    LBBB (left bundle branch block)    Obesity     Home Medications Prior to Admission medications   Medication Sig Start Date End Date Taking? Authorizing Provider  aluminum-magnesium hydroxide-simethicone (MAALOX) 200-200-20 MG/5ML SUSP Take 30 mLs by mouth 4 (four) times daily -  before meals and at bedtime. 10/24/22   Sharman Cheek, MD  ASHWAGANDHA PO Take 2 capsules by mouth daily.    [provider]  buPROPion (WELLBUTRIN XL) 300 MG 24 hr tablet Take 300 mg by mouth every morning. 01/05/16   [provider]  Calcium Carbonate (CALCIUM 500 PO) Take 2 tablets by mouth daily.    [provider]  Cholecalciferol (VITAMIN D3) 50 MCG (2000 UT) capsule Take 4,000 Units by mouth daily.    [provider]  Coenzyme Q10 (COQ10) 100 MG CAPS Take 200 mg by mouth daily.    [provider]  famotidine (PEPCID) 20 MG tablet Take 1 tablet (20 mg total) by mouth 2 (two) times daily. 10/24/22   Sharman Cheek, MD  HYDROcodone-acetaminophen (NORCO) 5-325 MG tablet Take 1 tablet by mouth every 6 (six) hours as needed for up to 6 doses for moderate pain. Patient not taking: Reported on 06/20/2021 06/13/21   Sung Amabile, DO  HYDROcodone-acetaminophen (NORCO) 5-325 MG tablet Take 1 tablet by mouth every 6 (six) hours as needed for up to 6 doses for moderate pain. Patient not taking: Reported on 08/14/2021 06/27/21   Sung Amabile,  DO  ibandronate (BONIVA) 150 MG tablet Take 150 mg by mouth every 30 (thirty) days. 05/02/21   [provider]  ibuprofen (ADVIL) 800 MG tablet Take 1 tablet (800 mg total) by mouth every 8 (eight) hours as needed for mild pain or moderate pain. 06/13/21   Sung Amabile, DO  Misc Natural Products (GLUCOSAMINE CHOND COMPLEX/MSM PO) Take 1 tablet by mouth in the morning, at noon, and at bedtime.    [provider]  modafinil (PROVIGIL) 200 MG tablet Take 200 mg by mouth daily as needed (Alertness).    [provider]  Multiple Vitamin (MULTIVITAMIN) tablet Take 2 tablets by mouth every morning. Gummies    [provider]  Omega-3 Fatty Acids (FISH OIL PO) Take 3 each by mouth daily. 450 mg per gummy    [provider]  Probiotic Product (PROBIOTIC-10 PO) Take 1 capsule by mouth 2 (two) times daily.     [provider]  QUEtiapine (SEROQUEL) 200 MG tablet Take 200 mg by mouth at bedtime. 06/11/21   [provider]  TURMERIC CURCUMIN PO Take 2 capsules by mouth daily.    [provider]  vitamin B-12 (CYANOCOBALAMIN) 1000 MCG tablet Take 2,000 mcg by mouth daily.    [provider]  vitamin C (ASCORBIC ACID) 250 MG tablet Take 500 mg by mouth daily. Patient not taking: Reported on 08/14/2021  [provider]  vortioxetine HBr (TRINTELLIX) 20 MG TABS tablet Take 20 mg by mouth daily.    [provider]  ZINC GLUCONATE PO Take 7.5 mg by mouth daily.    [provider]      Allergies    Patient has no known allergies.    Review of Systems   Review of Systems  Physical Exam Updated Vital Signs BP (!) 67/37   Pulse (!) 39   Temp 100 F (37.8 C)   Resp (!) 46   SpO2 98%  Physical Exam  ED Results / Procedures / Treatments   Labs (all labs ordered are listed, but only abnormal results are displayed) Labs Reviewed  I-STAT CHEM 8, ED - Abnormal; Notable for the following components:       Result Value   Sodium 124 (*)    Potassium 5.7 (*)    BUN 49 (*)    Creatinine, Ser 2.90 (*)    Glucose, Bld 164 (*)    Calcium, Ion 0.97 (*)    TCO2 16 (*)    Hemoglobin 11.9 (*)    HCT 35.0 (*)    All other components within normal limits  CBG MONITORING, ED - Abnormal; Notable for the following components:   Glucose-Capillary 57 (*)    All other components within normal limits  CBG MONITORING, ED - Abnormal; Notable for the following components:   Glucose-Capillary 161 (*)    All other components within normal limits  CULTURE, BLOOD (ROUTINE X 2)  CULTURE, BLOOD (ROUTINE X 2)  CBC WITH DIFFERENTIAL/PLATELET  COMPREHENSIVE METABOLIC PANEL  LACTIC ACID, PLASMA  LACTIC ACID, PLASMA  TROPONIN I (HIGH SENSITIVITY)    EKG None  Radiology DG Chest Portable 1 View  Result Date: 10/22/2022 CLINICAL DATA:  Altered mental status. EXAM: PORTABLE CHEST 1 VIEW COMPARISON:  Chest radiograph 10/24/2022, 07/04/2015 FINDINGS: Endotracheal tube tip projects 4.3 cm above the carina. Enteric tube tip is below the diaphragm but not included on the image. Heart is normal in size. Aortic calcifications. Retrocardiac airspace opacity in the left lower lobe. Trace left pleural fluid. No pneumothorax. Coarse reticular interstitial opacities at the lung bases, similar to prior exams. No acute osseous abnormality. Old left posterior seventh rib fracture IMPRESSION: 1. Endotracheal tube tip projects 4.3 cm above the carina. Enteric tube tip is below the diaphragm but not included on the image. 2. Retrocardiac airspace opacity in the left lower lobe, concerning for pneumonia. Electronically Signed   By: Sherron Ales M.D.   On: 10/26/2022 13:06   US Venous Img Lower Unilateral Left  Result Date: 10/26/2022 CLINICAL DATA:  77 year old female with left lower extremity edema, calf tenderness. EXAM: LEFT LOWER EXTREMITY VENOUS DOPPLER ULTRASOUND TECHNIQUE: Gray-scale sonography with compression, as well as color  and duplex ultrasound, were performed to evaluate the deep venous system(s) from the level of the common femoral vein through the popliteal and proximal calf veins. COMPARISON:  None Available. FINDINGS: VENOUS Normal compressibility of the common femoral, superficial femoral, and popliteal veins, as well as the visualized calf veins. Visualized portions of profunda femoral vein and great saphenous vein unremarkable. No filling defects to suggest DVT on grayscale or color Doppler imaging. Doppler waveforms show normal direction of venous flow, normal respiratory plasticity and response to augmentation. Limited views of the contralateral common femoral vein are unremarkable. OTHER None. Limitations: none IMPRESSION: No evidence of left lower extremity deep venous thrombosis. Electronically Signed   By: Althea Grimmer.D.  On: 10/26/2022 08:04    Procedures Procedures  {Document cardiac monitor, telemetry assessment procedure when appropriate:1}  Medications Ordered in ED Medications  norepinephrine (LEVOPHED)  in (0.016 mg/mL) premix infusion (0 mcg/min Intravenous Stopped 10/11/2022 1316)  EPINEPHrine (ADRENALIN) 5 mg in NS 250 mL (0.02 mg/mL) premix infusion (0 mcg/min Intravenous Stopped 10/23/2022 1316)  0.9 %  sodium chloride infusion (0 mLs Intravenous Stopped 10/15/2022 1258)  EPINEPHrine (ADRENALIN) 1 MG/10ML injection (1 mg Intravenous Given 11/04/2022 1213)  succinylcholine (ANECTINE) injection (70 mg Intravenous Given 10/19/2022 1214)  etomidate (AMIDATE) injection (20 mg Intravenous Given 10/24/2022 1215)  EPINEPHrine (ADRENALIN) 1 MG/10ML injection (1 mg Intravenous Given 10/19/2022 1217)  sodium bicarbonate injection (50 mEq Intravenous Given 10/18/2022 1218)  dextrose 50 % solution (1 ampule Intravenous Given 10/08/2022 1222)  phenylephrine (NEO-SYNEPHRINE) injection (160 mcg  Given 10/06/2022 1231)  EPINEPHrine (ADRENALIN) 1 MG/10ML injection (1 mg Intravenous Given 10/20/2022 1234)  atropine 1 MG/10ML  injection (1 mg Intravenous Given 10/14/2022 1241)  calcium chloride injection (1 g Intravenous Given 11/01/2022 1237)  EPINEPHrine (ADRENALIN) 1 MG/10ML injection (1 mg Intravenous Given 11/01/2022 1239)  EPINEPHrine (ADRENALIN) 1 MG/10ML injection (0.5 mg Intravenous Given 10/31/2022 1248)  EPINEPHrine (ADRENALIN) 1 MG/10ML injection (1 mg Intravenous Given 10/30/2022 1251)  atropine 1 MG/10ML injection (1 mg Intravenous Given 10/13/2022 1256)  EPINEPHrine (ADRENALIN) 1 MG/10ML injection (1 mg Intravenous Given 10/18/2022 1300)  EPINEPHrine (ADRENALIN) 1 MG/10ML injection (1 mg Intravenous Given 11/03/2022 1315)    ED Course/ Medical Decision Making/ A&P   {   Click here for ABCD2, HEART and other calculatorsREFRESH Note before signing :1}                          Medical Decision Making Amount and/or Complexity of Data Reviewed Labs: ordered. Radiology: ordered.  Risk Prescription drug management.   ***  {Document critical care time when appropriate:1} {Document review of labs and clinical decision tools ie heart score, Chads2Vasc2 etc:1}  {Document your independent review of radiology images, and any outside records:1} {Document your discussion with family members, caretakers, and with consultants:1} {Document social determinants of health affecting pt's care:1} {Document your decision making why or why not admission, treatments were needed:1} Final Clinical Impression(s) / ED Diagnoses Final diagnoses:  Cardiac arrest    Rx / DC Orders ED Discharge Orders     None

## 2022-11-05 DEATH — deceased

## 2022-11-26 ENCOUNTER — Ambulatory Visit: Payer: BC Managed Care – PPO | Admitting: Dermatology

## 2023-02-13 IMAGING — MG MM DIGITAL SCREENING BILAT W/ TOMO AND CAD
6 of 12 series · 6 of 36 positions shown · non-contrast
Comparison: Previous exam(s).

CLINICAL DATA: Screening. Normal

EXAM:
DIGITAL SCREENING BILATERAL MAMMOGRAM WITH TOMOSYNTHESIS AND CAD
TECHNIQUE: Bilateral screening digital craniocaudal and mediolateral oblique
mammograms were obtained. Bilateral screening digital breast
tomosynthesis was performed. The images were evaluated with
computer-aided detection.

[R MLO synth-2D (1 of 2)]
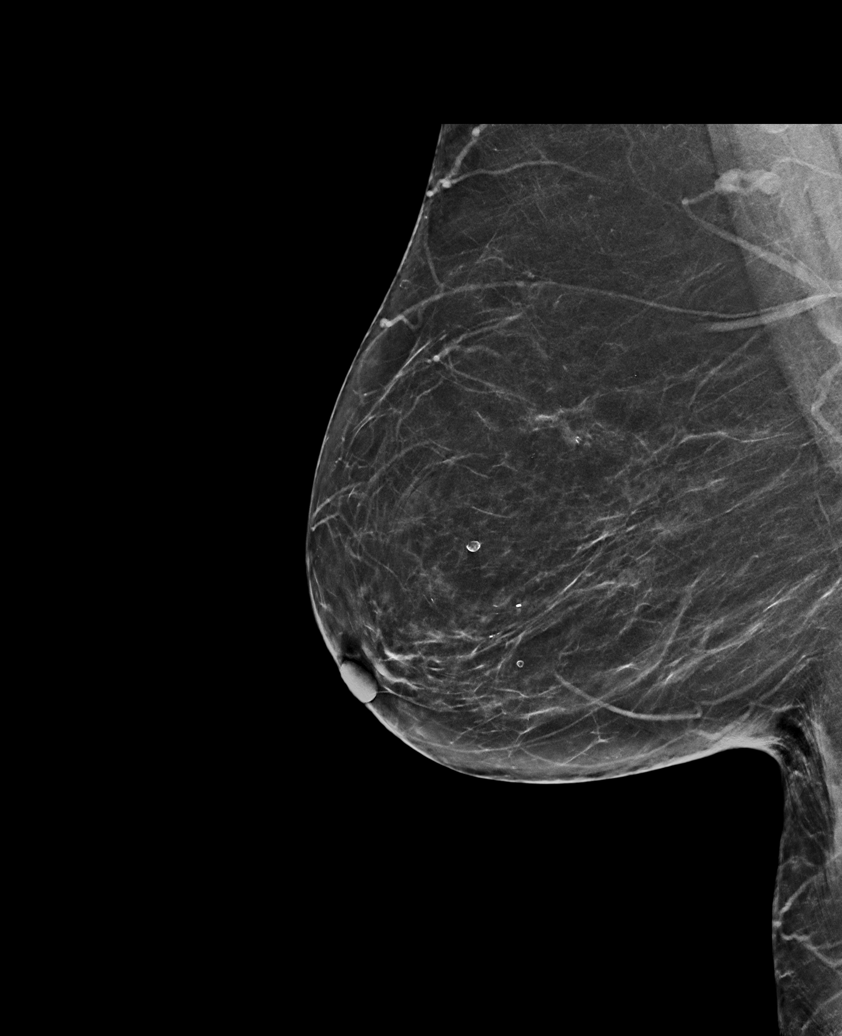

[L MLO synth-2D (1 of 2)]
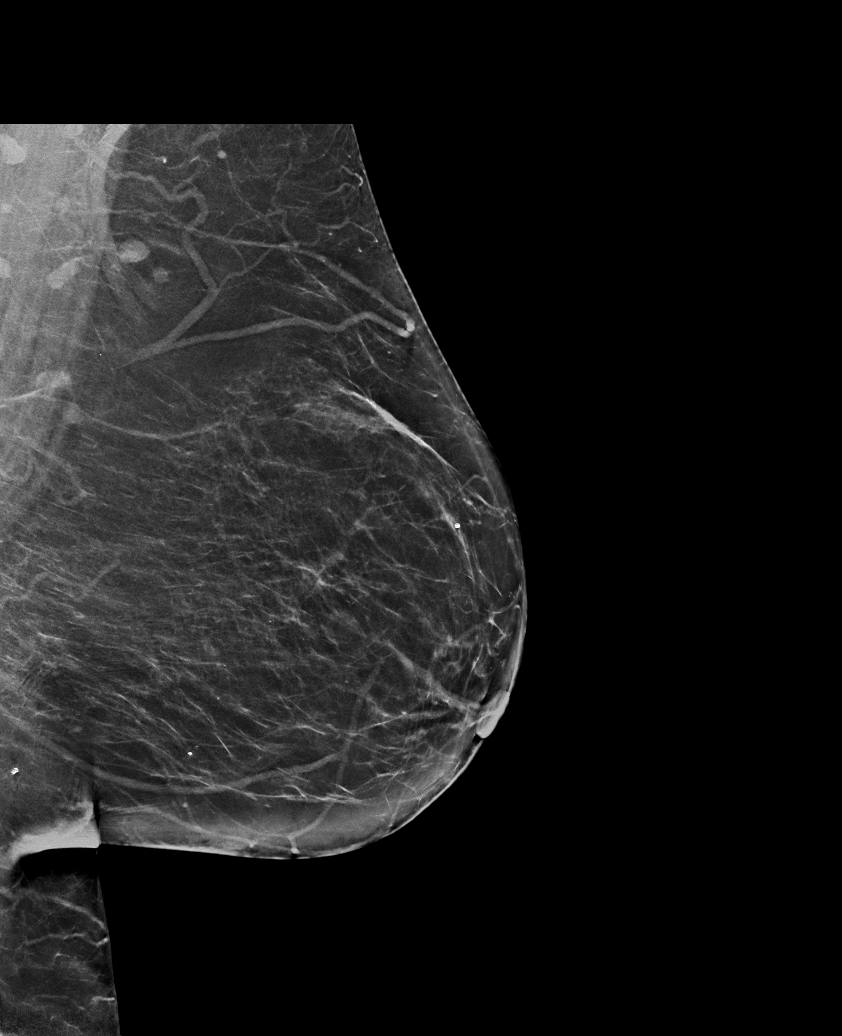

[L CC synth-2D]
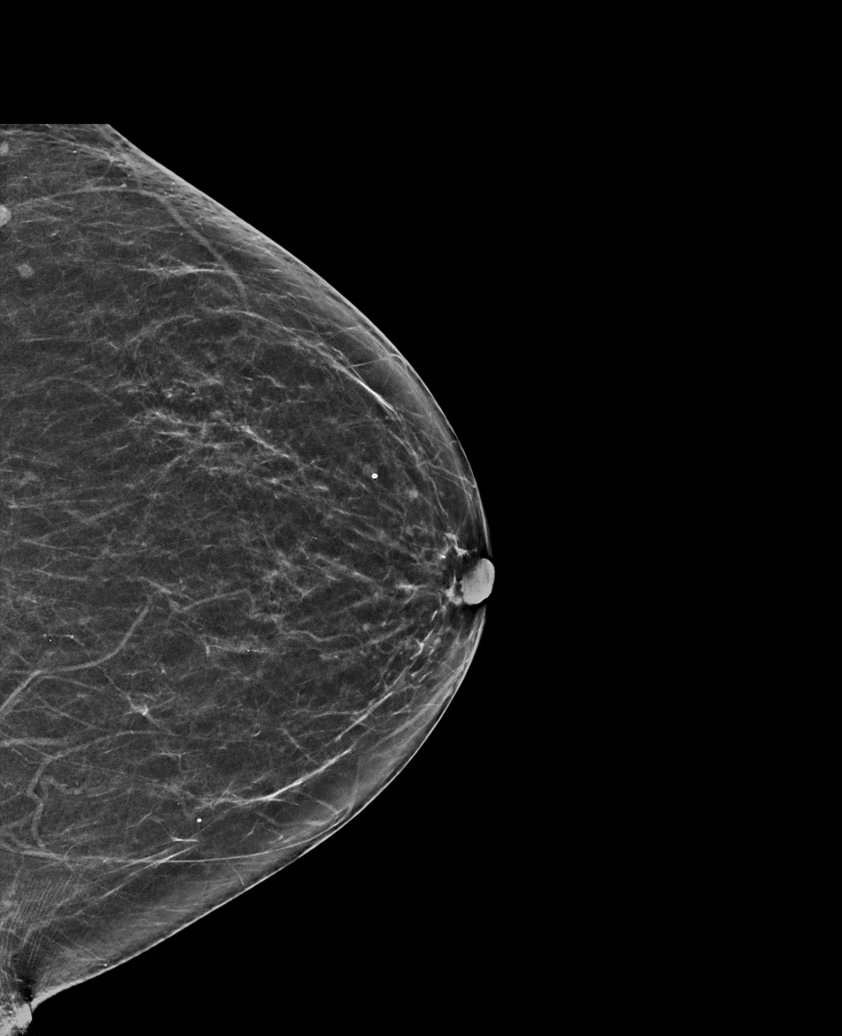

[R MLO synth-2D (2 of 2)]
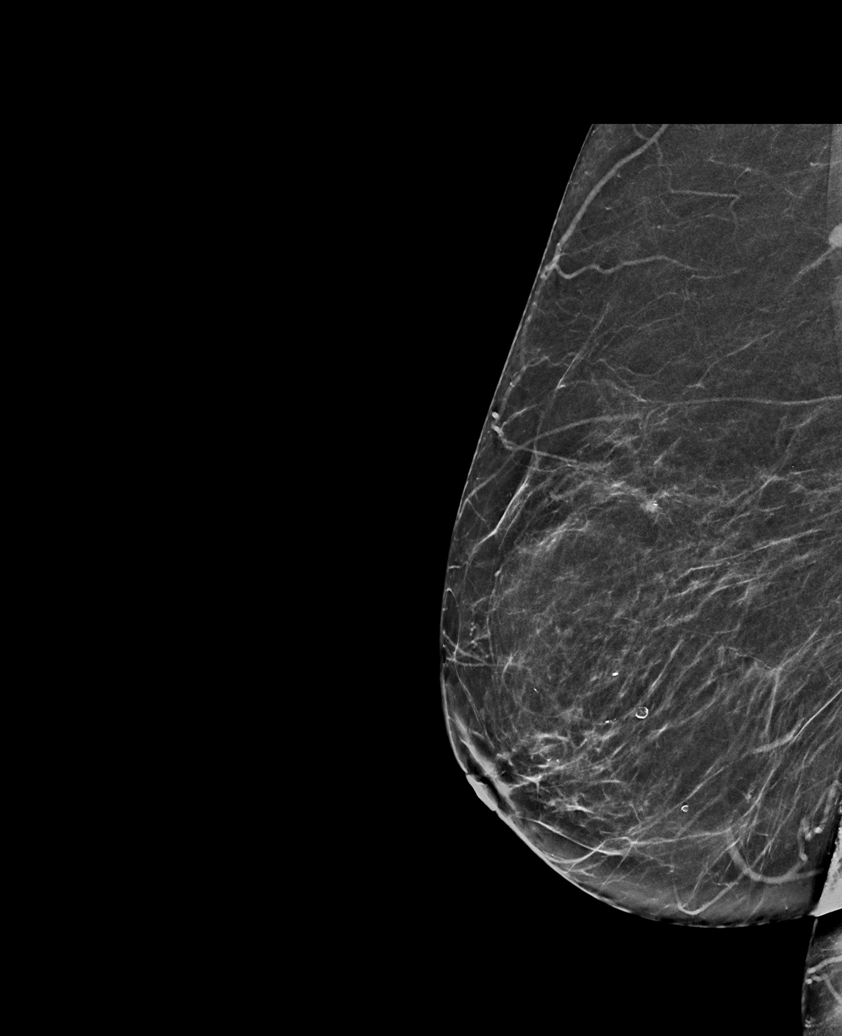

[R CC synth-2D]
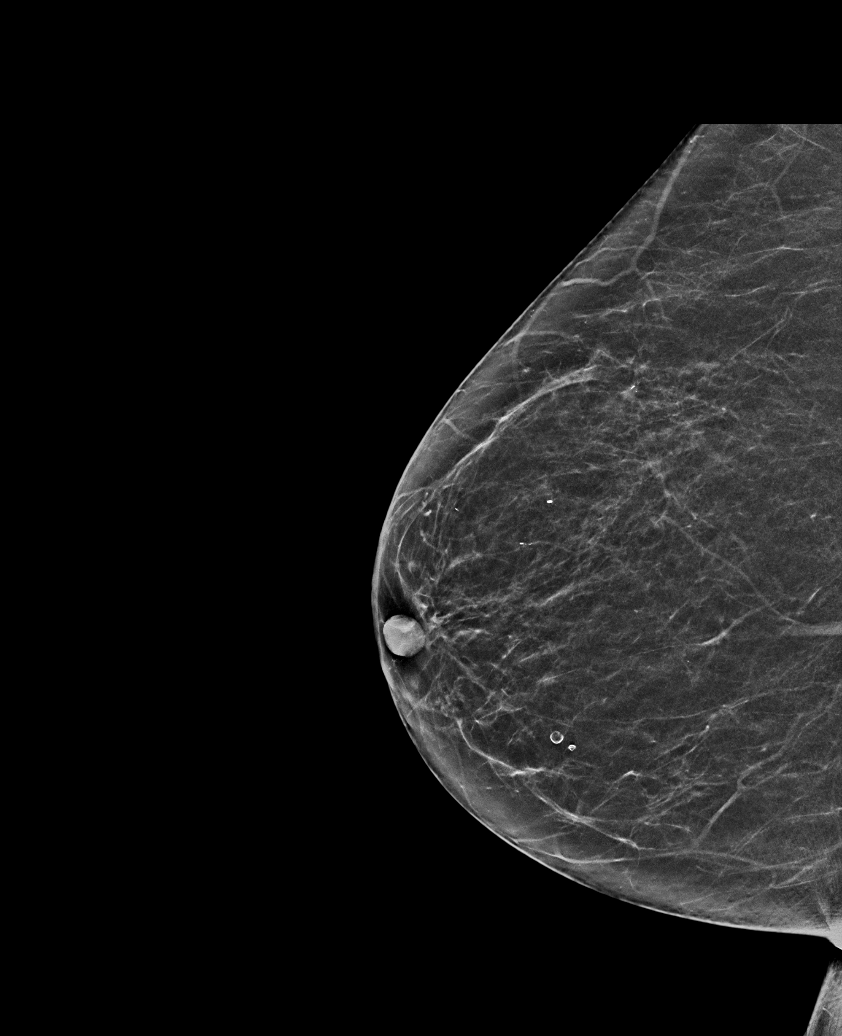

[L MLO synth-2D (2 of 2)]
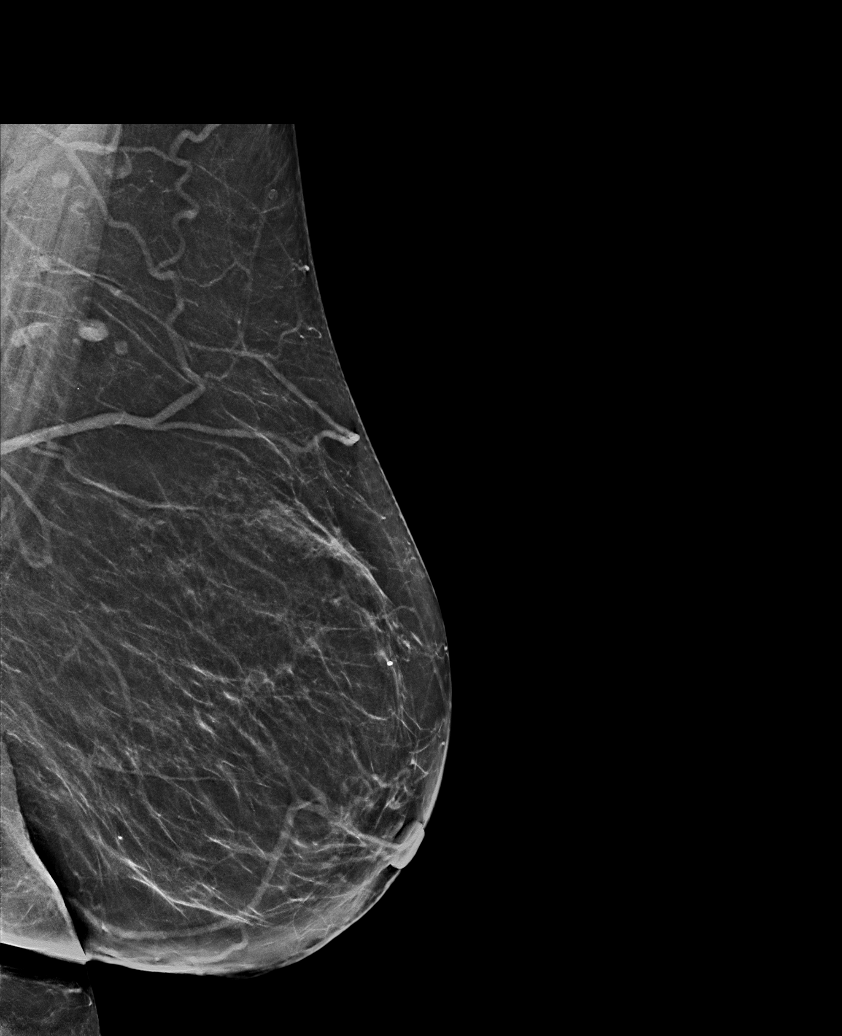

[6 of 36 positions shown; findings below may reference images not displayed]

ACR Breast Density Category b: There are scattered areas of
fibroglandular density.
FINDINGS: In the right breast, a possible mass in the upper-outer quadrant
warrants further evaluation. In the left breast, no findings
suspicious for malignancy.
IMPRESSION: Further evaluation is suggested for a possible mass in the right
breast.

RECOMMENDATION:
Diagnostic mammogram and possibly ultrasound of the right breast.
(Code:FI-[id number])

The patient will be contacted regarding the findings, and additional
imaging will be scheduled.

BI-RADS CATEGORY  0: Incomplete. Need additional imaging evaluation
and/or prior mammograms for comparison.
